# Patient Record
Sex: Male | Born: 1960 | Race: White | Hispanic: No | Marital: Married | Smoking: Never smoker
Health system: Southern US, Community
[De-identification: ages and names within clinical notes are randomized; demographics above are authoritative.]

## PROBLEM LIST (undated history)

## (undated) DIAGNOSIS — E0590 Thyrotoxicosis, unspecified without thyrotoxic crisis or storm: Secondary | ICD-10-CM

## (undated) DIAGNOSIS — E079 Disorder of thyroid, unspecified: Secondary | ICD-10-CM

## (undated) DIAGNOSIS — Z8739 Personal history of other diseases of the musculoskeletal system and connective tissue: Secondary | ICD-10-CM

## (undated) DIAGNOSIS — K219 Gastro-esophageal reflux disease without esophagitis: Secondary | ICD-10-CM

## (undated) DIAGNOSIS — Z87442 Personal history of urinary calculi: Secondary | ICD-10-CM

## (undated) DIAGNOSIS — M1990 Unspecified osteoarthritis, unspecified site: Secondary | ICD-10-CM

## (undated) DIAGNOSIS — E039 Hypothyroidism, unspecified: Secondary | ICD-10-CM

## (undated) HISTORY — DX: Disorder of thyroid, unspecified: E07.9

## (undated) HISTORY — PX: FRACTURE SURGERY: SHX138

## (undated) HISTORY — PX: COLONOSCOPY: SHX174

## (undated) HISTORY — DX: Personal history of other diseases of the musculoskeletal system and connective tissue: Z87.39

## (undated) HISTORY — DX: Unspecified osteoarthritis, unspecified site: M19.90

---

## 2008-11-04 ENCOUNTER — Ambulatory Visit: Payer: Self-pay | Admitting: Unknown Physician Specialty

## 2010-05-30 IMAGING — NM NM THYROID IMAGING W/ UPTAKE SINGLE (24 HR)
1 series · 3 of 3 positions shown · non-contrast
Comparison: none

REASON FOR EXAM: hyperthyroid     depressed TSH
COMMENTS:

[Series 1000: (id) thyroid scan · 2.40mm/px · 3 of 3 slices shown]
[im 1/3  full-range]
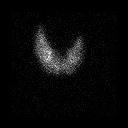
[im 2/3  full-range]
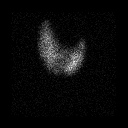
[im 3/3  full-range]
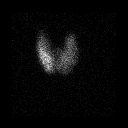

[3 of 3 positions shown; findings below may reference images not displayed]

PROCEDURE:     NM  - NM THYROID A-1D2 24 HR [DATE]  [DATE]

RESULT:     Following oral administration of 142.2 microcuries radioactive
iodine A-1D2, the 6-hour uptake measured 22.5% and the 24-hour uptake
measured 36%. The 24-hour uptake is slightly above the upper limit for
normal which is 35% and is, therefore, in the hyperthyroid range. Thyroid
scan was performed in the anterior and both oblique views. The right lobe is
slightly larger than the left. There is homogeneous distribution of tracer
activity in both lobes. No hot or cold nodules are seen.
IMPRESSION: 1. The 24-hour thyroid uptake measures 36% which is above the normal range.
2. Normal thyroid scan.

## 2013-08-03 ENCOUNTER — Ambulatory Visit: Payer: Self-pay | Admitting: Gastroenterology

## 2013-08-06 LAB — PATHOLOGY REPORT

## 2014-07-03 ENCOUNTER — Ambulatory Visit: Payer: Self-pay | Admitting: Family Medicine

## 2014-08-16 ENCOUNTER — Ambulatory Visit
Admit: 2014-08-16 | Disposition: A | Discharge status: Home / Self Care | Payer: Self-pay | Attending: General Practice | Admitting: General Practice

## 2014-09-08 NOTE — Op Note (Signed)
PATIENT NAME:  Juan Hernandez, Juan Hernandez MR#:  [id number] DATE OF BIRTH:  10-24-1960  DATE OF PROCEDURE:  08/16/2014  PREOPERATIVE DIAGNOSIS: Internal derangement of the left knee.   POSTOPERATIVE DIAGNOSES:  1. Tear of the posterior horn, medial meniscus.  2. 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   PROCEDURES PERFORMED: Left knee arthroscopy, partial medial meniscectomy, and medial chondroplasty.   SURGEON: Illene LabradorJames P. Angie FavaHooten Jr., MD   ANESTHESIA: General.   ESTIMATED BLOOD LOSS: Minimal.   TOURNIQUET TIME: Not used.   DRAINS: None.   INDICATIONS FOR SURGERY: The patient is a 54 year old male who has been seen for complaints of persistent left knee pain. MRI demonstrated findings consistent with meniscal pathology. After discussion of the risks and benefits of surgical intervention, the patient expressed understanding of the risks and benefits and agreed with plans for surgical intervention.   PROCEDURE IN DETAIL: The patient was brought into the operating room and, after adequate general anesthesia was achieved, a tourniquet was placed on the patient's left thigh and leg was placed in a leg holder. All bony prominences were well padded. The patient's left knee and leg were cleaned and prepped with alcohol and DuraPrep, draped in the usual sterile fashion. A "timeout" was performed as per usual protocol. The anticipated portal sites were injected with 0.25% Marcaine with epinephrine. An anterolateral portal was created and a cannula was inserted. A small effusion was evacuated. The scope was inserted and the knee was distended with fluid using the pump. The scope was advanced down the medial gutter into the medial compartment of the knee. Under visualization with the scope, an anteromedial portal was created and hook probe was inserted. Inspection of the medial compartment demonstrated a complex tear of the posterior horn of the medial meniscus with a flap-type lesion, as  well as what appeared to be horizontal cleavage tear. The area of the tear was debrided using meniscal punches and a 4.5 mm shaver. Final contouring of the posterior and transitional zone was performed using a 50 degrees ArthroCare wand. The anterior horn was visualized and probed and felt to be stable. Inspection of the articular cartilage demonstrated some grade 2 to 3 changes, primarily involving the lateral margin of the medial femoral condyle. These areas were debrided using the 50 degrees ArthroCare wand. The scope was removed from the anterolateral portal and reinserted via the anteromedial portal, so as to better visualize the lateral compartment. Inspection of the anterior cruciate ligament was performed with ACL noted to be stable. The scope was then advanced into the lateral compartment. The articular surface of the lateral compartment was in excellent condition. The lateral meniscus was visualized and probed and felt to be stable with only mild fraying along the inner margin. Finally, the scope was positioned so as to visualize the patellofemoral articulation. Good patellar tracking was noted. The articular surface was in good condition. The knee was irrigated with copious amounts of fluid and then suctioned dry. The anterolateral portal was reapproximated using 3-0 nylon. A combination of 0.25% Marcaine with epinephrine and 4 mg morphine was injected via the scope. The scope was removed and the anteromedial portal was reapproximated using #3-0 nylon. A sterile dressing was applied followed by application of ice wrap. The patient tolerated the procedure well. She was transported to the recovery room in stable condition.     ____________________________ Illene LabradorJames P. Angie FavaHooten Jr., MD jph:mw D: 08/17/2014 11:27:17 ET T: 08/17/2014 11:42:42 ET JOB#: [id number]  cc: Fayrene FearingJames P.  Angie Fava., MD, <Dictator> JAMES P Angie Fava MD ELECTRONICALLY SIGNED 08/18/2014 8:15

## 2019-08-16 ENCOUNTER — Ambulatory Visit: Payer: Self-pay | Attending: Internal Medicine

## 2019-08-16 DIAGNOSIS — Z23 Encounter for immunization: Secondary | ICD-10-CM

## 2019-08-16 NOTE — Progress Notes (Signed)
   Covid-19 Vaccination Clinic  Name:  Juan Hernandez    MRN: [id number] DOB: 12-10-1960  08/16/2019  [name] was observed post Covid-19 immunization for 15 minutes without incident. He was provided with Vaccine Information Sheet and instruction to access the V-Safe system.   [name] was instructed to call 911 with any severe reactions post vaccine: Marland Kitchen Difficulty breathing  . Swelling of face and throat  . A fast heartbeat  . A bad rash all over body  . Dizziness and weakness   Immunizations Administered    Name Date Dose VIS Date Route   Pfizer COVID-19 Vaccine 08/16/2019  8:56 AM 0.3 mL 04/20/2019 Intramuscular   Manufacturer: [employer], [employer]   Lot: [id number]   NDC: [address]-1

## 2019-09-11 ENCOUNTER — Ambulatory Visit: Payer: Self-pay | Attending: Internal Medicine

## 2019-09-11 DIAGNOSIS — Z23 Encounter for immunization: Secondary | ICD-10-CM

## 2019-09-11 NOTE — Progress Notes (Signed)
   Covid-19 Vaccination Clinic  Name:  Juan Hernandez    MRN: [id number] DOB: 07/20/1960  09/11/2019  [name] was observed post Covid-19 immunization for 15 minutes without incident. He was provided with Vaccine Information Sheet and instruction to access the V-Safe system.   [name] was instructed to call 911 with any severe reactions post vaccine: Marland Kitchen Difficulty breathing  . Swelling of face and throat  . A fast heartbeat  . A bad rash all over body  . Dizziness and weakness   Immunizations Administered    Name Date Dose VIS Date Route   Pfizer COVID-19 Vaccine 09/11/2019  9:14 AM 0.3 mL 07/04/2018 Intramuscular   Manufacturer: [employer], [employer]   Lot: [id number]   NDC: [address]-4

## 2022-05-01 DIAGNOSIS — Z6838 Body mass index (BMI) 38.0-38.9, adult: Secondary | ICD-10-CM | POA: Diagnosis not present

## 2022-05-01 DIAGNOSIS — B001 Herpesviral vesicular dermatitis: Secondary | ICD-10-CM | POA: Diagnosis not present

## 2022-10-07 ENCOUNTER — Encounter: Payer: Self-pay | Admitting: Internal Medicine

## 2022-10-07 ENCOUNTER — Ambulatory Visit: Payer: 59 | Admitting: Internal Medicine

## 2022-10-07 VITALS — BP 126/76 | HR 65 | Temp 98.2°F | Resp 16 | Ht 68.0 in | Wt 274.0 lb

## 2022-10-07 DIAGNOSIS — E7800 Pure hypercholesterolemia, unspecified: Secondary | ICD-10-CM | POA: Diagnosis not present

## 2022-10-07 DIAGNOSIS — Z125 Encounter for screening for malignant neoplasm of prostate: Secondary | ICD-10-CM

## 2022-10-07 DIAGNOSIS — E039 Hypothyroidism, unspecified: Secondary | ICD-10-CM | POA: Diagnosis not present

## 2022-10-07 DIAGNOSIS — R7989 Other specified abnormal findings of blood chemistry: Secondary | ICD-10-CM | POA: Diagnosis not present

## 2022-10-07 DIAGNOSIS — M109 Gout, unspecified: Secondary | ICD-10-CM | POA: Diagnosis not present

## 2022-10-07 DIAGNOSIS — Z1211 Encounter for screening for malignant neoplasm of colon: Secondary | ICD-10-CM

## 2022-10-07 MED ORDER — COLCHICINE 0.6 MG PO TABS: 0.6000 mg | ORAL_TABLET | Freq: Two times a day (BID) | ORAL | 0 refills | Status: DC | PRN

## 2022-10-07 NOTE — Progress Notes (Addendum)
Subjective:    Patient ID: Juan Hernandez, male    DOB: 1960-05-12, 62 y.o.   MRN: [id number]  Patient here for  Chief Complaint  Patient presents with   Establish Care    HPI Here to establish care.  Has been seeing Dr Noralee Chars.  Has a history of hypercholesterolemia, gout and hypothyroidism.  Previous ankle surgery.  History of compound fracture - age 52.  Reports no chest pain or sob.  No abdominal pain or bowel change reported.  Some allergy issues.  Nasal stuffiness.  Occasionally smokes a cigar.  Has some concern regarding possible gout flare.  States had first episode years ago.  A few months ago, had a flare.  Noticed increased pain - last 24 hours.  Noticed left ankle.  Feels similar to previous gout flare.  Increased pain. Taking ibuprofen.  Nocturia x 1.  Had colonoscopy age 25.  Paternal uncle colon cancer. Has history of low testosterone.  Shingles at Christmas.     Past Medical History:  Diagnosis Date   Arthritis    History of gout    Thyroid disease    Past Surgical History:  Procedure Laterality Date   FRACTURE SURGERY  1983   Left ankle compound fracture   Family History  Problem Relation Age of Onset   COPD Mother    Diabetes Mother    Arthritis Father    Obesity Father    Social History   Socioeconomic History   Marital status: Married    Spouse name: Not on file   Number of children: Not on file   Years of education: Not on file   Highest education level: Not on file  Occupational History   Not on file  Tobacco Use   Smoking status: Never   Smokeless tobacco: Never  Substance and Sexual Activity   Alcohol use: Yes    Alcohol/week: 6.0 standard drinks of alcohol    Types: 2 Glasses of wine, 2 Cans of beer, 2 Shots of liquor per week   Drug use: Not Currently   Sexual activity: Not Currently  Other Topics Concern   Not on file  Social History Narrative   Not on file   Social Determinants of Health   Financial Resource Strain: Not on  file  Food Insecurity: Not on file  Transportation Needs: Not on file  Physical Activity: Not on file  Stress: Not on file  Social Connections: Not on file     Review of Systems  Constitutional:  Negative for appetite change and unexpected weight change.  HENT:  Negative for sinus pressure.        Occasional allergies/congestion.   Respiratory:  Negative for cough, chest tightness and shortness of breath.   Cardiovascular:  Negative for chest pain and palpitations.  Gastrointestinal:  Negative for abdominal pain, diarrhea, nausea and vomiting.  Genitourinary:  Negative for difficulty urinating and dysuria.  Musculoskeletal:  Negative for joint swelling and myalgias.  Skin:  Negative for color change and rash.  Neurological:  Negative for dizziness and headaches.  Psychiatric/Behavioral:  Negative for agitation and dysphoric mood.        Objective:     BP 126/76   Pulse 65   Temp 98.2 F (36.8 C)   Resp 16   Ht 5\' 8"  (1.727 m)   Wt 274 lb (124.3 kg)   SpO2 98%   BMI 41.66 kg/m  Wt Readings from Last 3 Encounters:  10/07/22 274 lb (124.3 kg)  Physical Exam Vitals reviewed.  Constitutional:      General: He is not in acute distress.    Appearance: Normal appearance. He is well-developed.  HENT:     Head: Normocephalic and atraumatic.     Right Ear: External ear normal.     Left Ear: External ear normal.  Eyes:     General: No scleral icterus.       Right eye: No discharge.        Left eye: No discharge.     Conjunctiva/sclera: Conjunctivae normal.  Cardiovascular:     Rate and Rhythm: Normal rate and regular rhythm.  Pulmonary:     Effort: Pulmonary effort is normal. No respiratory distress.     Breath sounds: Normal breath sounds.  Abdominal:     General: Bowel sounds are normal.     Palpations: Abdomen is soft.     Tenderness: There is no abdominal tenderness.  Musculoskeletal:        General: No swelling or tenderness.     Cervical back: Neck  supple. No tenderness.  Lymphadenopathy:     Cervical: No cervical adenopathy.  Skin:    Findings: No erythema or rash.  Neurological:     Mental Status: He is alert.  Psychiatric:        Mood and Affect: Mood normal.        Behavior: Behavior normal.      Outpatient Encounter Medications as of 10/07/2022  Medication Sig   colchicine 0.6 MG tablet Take 1 tablet (0.6 mg total) by mouth 2 (two) times daily as needed.   No facility-administered encounter medications on file as of 10/07/2022.     Lab Results  Component Value Date   WBC 7.2 10/11/2022   HGB 14.5 10/11/2022   HCT 42.8 10/11/2022   PLT 207.0 10/11/2022   GLUCOSE 87 10/11/2022   CHOL 260 (H) 10/11/2022   TRIG 127.0 10/11/2022   HDL 38.80 (L) 10/11/2022   LDLCALC 195 (H) 10/11/2022   ALT 51 10/11/2022   AST 67 (H) 10/11/2022   NA 136 10/11/2022   K 4.5 10/11/2022   CL 100 10/11/2022   CREATININE 1.20 10/11/2022   BUN 14 10/11/2022   CO2 28 10/11/2022   TSH 98.93 (H) 10/11/2022   PSA 0.17 10/11/2022    No results found.     Assessment & Plan:  Hypercholesterolemia Assessment & Plan: Low cholesterol diet and exercise.  Follow lipid panel.   Orders: -     CBC with Differential/Platelet; Future -     Basic metabolic panel; Future -     Hepatic function panel; Future -     Lipid panel; Future  Hypothyroidism, unspecified type Assessment & Plan: Documented history of thyroid disease.  Check tsh.   Orders: -     TSH; Future  Gout, unspecified cause, unspecified chronicity, unspecified site Assessment & Plan: History of gout.  Pain currently - ankle.  Feels similar to previous gout flare.  Treat with colchicine.  Has been taking ibuprofen.  Check uric acid level.    Orders: -     Uric acid; Future  Prostate cancer screening -     PSA; Future  Low testosterone level in male Assessment & Plan: Reports history of low testosterone level.  Check testosterone level with next labs.   Orders: -      Testosterone; Future  Colon cancer screening Assessment & Plan: Last colonoscopy age 30.  Due f/u colonoscopy.    Other orders -  Colchicine; Take 1 tablet (0.6 mg total) by mouth 2 (two) times daily as needed.  Dispense: 60 tablet; Refill: 0     Dale Queets, MD

## 2022-10-11 ENCOUNTER — Encounter: Payer: Self-pay | Admitting: Internal Medicine

## 2022-10-11 ENCOUNTER — Other Ambulatory Visit (INDEPENDENT_AMBULATORY_CARE_PROVIDER_SITE_OTHER): Payer: 59

## 2022-10-11 DIAGNOSIS — E7800 Pure hypercholesterolemia, unspecified: Secondary | ICD-10-CM

## 2022-10-11 DIAGNOSIS — Z125 Encounter for screening for malignant neoplasm of prostate: Secondary | ICD-10-CM | POA: Diagnosis not present

## 2022-10-11 DIAGNOSIS — E039 Hypothyroidism, unspecified: Secondary | ICD-10-CM

## 2022-10-11 DIAGNOSIS — R7989 Other specified abnormal findings of blood chemistry: Secondary | ICD-10-CM | POA: Insufficient documentation

## 2022-10-11 DIAGNOSIS — Z1211 Encounter for screening for malignant neoplasm of colon: Secondary | ICD-10-CM | POA: Insufficient documentation

## 2022-10-11 LAB — HEPATIC FUNCTION PANEL
ALT: 51 U/L (ref 0–53)
AST: 67 U/L — ABNORMAL HIGH (ref 0–37)
Albumin: 4.4 g/dL (ref 3.5–5.2)
Alkaline Phosphatase: 51 U/L (ref 39–117)
Bilirubin, Direct: 0.1 mg/dL (ref 0.0–0.3)
Total Bilirubin: 0.7 mg/dL (ref 0.2–1.2)
Total Protein: 8.1 g/dL (ref 6.0–8.3)

## 2022-10-11 LAB — LIPID PANEL
Cholesterol: 260 mg/dL — ABNORMAL HIGH (ref 0–200)
HDL: 38.8 mg/dL — ABNORMAL LOW (ref >39.00)
LDL Cholesterol: 195 mg/dL — ABNORMAL HIGH (ref 0–99)
NonHDL: 220.82
Total CHOL/HDL Ratio: 7
Triglycerides: 127 mg/dL (ref 0.0–149.0)
VLDL: 25.4 mg/dL (ref 0.0–40.0)

## 2022-10-11 LAB — CBC WITH DIFFERENTIAL/PLATELET
Basophils Absolute: 0.2 10*3/uL — ABNORMAL HIGH (ref 0.0–0.1)
Basophils Relative: 2.1 % (ref 0.0–3.0)
Eosinophils Absolute: 0.4 10*3/uL (ref 0.0–0.7)
Eosinophils Relative: 4.9 % (ref 0.0–5.0)
HCT: 42.8 % (ref 39.0–52.0)
Hemoglobin: 14.5 g/dL (ref 13.0–17.0)
Lymphocytes Relative: 32.6 % (ref 12.0–46.0)
Lymphs Abs: 2.3 10*3/uL (ref 0.7–4.0)
MCHC: 33.9 g/dL (ref 30.0–36.0)
MCV: 92.1 fl (ref 78.0–100.0)
Monocytes Absolute: 0.7 10*3/uL (ref 0.1–1.0)
Monocytes Relative: 9.9 % (ref 3.0–12.0)
Neutro Abs: 3.6 10*3/uL (ref 1.4–7.7)
Neutrophils Relative %: 50.5 % (ref 43.0–77.0)
Platelets: 207 10*3/uL (ref 150.0–400.0)
RBC: 4.65 Mil/uL (ref 4.22–5.81)
RDW: 14.5 % (ref 11.5–15.5)
WBC: 7.2 10*3/uL (ref 4.0–10.5)

## 2022-10-11 LAB — BASIC METABOLIC PANEL
BUN: 14 mg/dL (ref 6–23)
CO2: 28 mEq/L (ref 19–32)
Calcium: 9.2 mg/dL (ref 8.4–10.5)
Chloride: 100 mEq/L (ref 96–112)
Creatinine, Ser: 1.2 mg/dL (ref 0.40–1.50)
GFR: 65.51 mL/min (ref >60.00)
Glucose, Bld: 87 mg/dL (ref 70–99)
Potassium: 4.5 mEq/L (ref 3.5–5.1)
Sodium: 136 mEq/L (ref 135–145)

## 2022-10-11 LAB — TSH: TSH: 98.93 u[IU]/mL — ABNORMAL HIGH (ref 0.35–5.50)

## 2022-10-11 LAB — PSA: PSA: 0.17 ng/mL (ref 0.10–4.00)

## 2022-10-11 NOTE — Assessment & Plan Note (Signed)
Documented history of thyroid disease.  Check tsh.

## 2022-10-11 NOTE — Assessment & Plan Note (Signed)
Low cholesterol diet and exercise.  Follow lipid panel.   

## 2022-10-11 NOTE — Assessment & Plan Note (Signed)
Last colonoscopy age 62.  Due f/u colonoscopy.

## 2022-10-11 NOTE — Assessment & Plan Note (Signed)
Reports history of low testosterone level.  Check testosterone level with next labs.

## 2022-10-11 NOTE — Assessment & Plan Note (Signed)
History of gout.  Pain currently - ankle.  Feels similar to previous gout flare.  Treat with colchicine.  Has been taking ibuprofen.  Check uric acid level.

## 2022-10-11 NOTE — Addendum Note (Signed)
Addended by: Charm Barges on: 10/11/2022 10:27 PM   Modules accepted: Orders

## 2022-10-12 ENCOUNTER — Other Ambulatory Visit (INDEPENDENT_AMBULATORY_CARE_PROVIDER_SITE_OTHER): Payer: 59

## 2022-10-12 DIAGNOSIS — R7989 Other specified abnormal findings of blood chemistry: Secondary | ICD-10-CM | POA: Diagnosis not present

## 2022-10-12 DIAGNOSIS — M109 Gout, unspecified: Secondary | ICD-10-CM

## 2022-10-12 LAB — TESTOSTERONE: Testosterone: 181.13 ng/dL — ABNORMAL LOW (ref 300.00–890.00)

## 2022-10-12 LAB — URIC ACID: Uric Acid, Serum: 5.8 mg/dL (ref 4.0–7.8)

## 2022-10-13 ENCOUNTER — Other Ambulatory Visit: Payer: Self-pay

## 2022-10-13 DIAGNOSIS — R7989 Other specified abnormal findings of blood chemistry: Secondary | ICD-10-CM

## 2022-10-13 DIAGNOSIS — E039 Hypothyroidism, unspecified: Secondary | ICD-10-CM

## 2022-10-13 MED ORDER — LEVOTHYROXINE SODIUM 50 MCG PO TABS: 50.0000 ug | ORAL_TABLET | Freq: Every day | ORAL | 0 refills | Status: DC

## 2022-10-27 ENCOUNTER — Other Ambulatory Visit (INDEPENDENT_AMBULATORY_CARE_PROVIDER_SITE_OTHER): Payer: 59

## 2022-10-27 DIAGNOSIS — R7989 Other specified abnormal findings of blood chemistry: Secondary | ICD-10-CM

## 2022-10-27 DIAGNOSIS — E039 Hypothyroidism, unspecified: Secondary | ICD-10-CM | POA: Diagnosis not present

## 2022-10-27 LAB — HEPATIC FUNCTION PANEL
ALT: 46 U/L (ref 0–53)
AST: 57 U/L — ABNORMAL HIGH (ref 0–37)
Albumin: 4.3 g/dL (ref 3.5–5.2)
Alkaline Phosphatase: 43 U/L (ref 39–117)
Bilirubin, Direct: 0.1 mg/dL (ref 0.0–0.3)
Total Bilirubin: 0.6 mg/dL (ref 0.2–1.2)
Total Protein: 8.2 g/dL (ref 6.0–8.3)

## 2022-10-27 LAB — TSH: TSH: 73.94 u[IU]/mL — ABNORMAL HIGH (ref 0.35–5.50)

## 2022-10-28 ENCOUNTER — Telehealth: Payer: Self-pay

## 2022-10-28 NOTE — Telephone Encounter (Signed)
-----   Message from Dale Coweta, MD sent at 10/28/2022  5:04 AM EDT ----- Notify - TSH has improved, but still elevated.  Confirm on synthroid q day.  If so, then increase to q day and make sure taking correctly. (Please cancel the 11/24/22 lab.  He had tsh drawn yesterday).  Schedule f/u tsh in 4 weeks.  Also, one liver test remaining slightly elevated.  Continue diet and exercise.  Will follow.  Would like to schedule an abdominal ultrasound to better evaluate the liver.

## 2022-10-31 ENCOUNTER — Other Ambulatory Visit: Payer: Self-pay | Admitting: Internal Medicine

## 2022-11-01 NOTE — Telephone Encounter (Signed)
Received notification for refill colchicine.  This was given to him for an acute flare.  Please call and confirm no foot pain now.  Should not need to continue on colchicine.  Is he having any symptoms now?

## 2022-11-04 ENCOUNTER — Other Ambulatory Visit: Payer: Self-pay

## 2022-11-04 MED ORDER — LEVOTHYROXINE SODIUM 75 MCG PO TABS: 75.0000 ug | ORAL_TABLET | Freq: Every day | ORAL | 0 refills | Status: DC

## 2022-11-04 NOTE — Telephone Encounter (Signed)
Per result note 75 mcg levothyroxine has been sent in to pharmacy.

## 2022-11-05 NOTE — Telephone Encounter (Signed)
LMTCB

## 2022-11-24 ENCOUNTER — Other Ambulatory Visit: Payer: 59

## 2022-12-07 ENCOUNTER — Encounter: Payer: Self-pay | Admitting: Internal Medicine

## 2022-12-07 ENCOUNTER — Ambulatory Visit (INDEPENDENT_AMBULATORY_CARE_PROVIDER_SITE_OTHER): Payer: 59 | Admitting: Internal Medicine

## 2022-12-07 VITALS — BP 118/70 | HR 68 | Temp 97.9°F | Resp 16 | Ht 68.0 in | Wt 269.0 lb

## 2022-12-07 DIAGNOSIS — Z1211 Encounter for screening for malignant neoplasm of colon: Secondary | ICD-10-CM

## 2022-12-07 DIAGNOSIS — E039 Hypothyroidism, unspecified: Secondary | ICD-10-CM | POA: Diagnosis not present

## 2022-12-07 DIAGNOSIS — Z0000 Encounter for general adult medical examination without abnormal findings: Secondary | ICD-10-CM

## 2022-12-07 DIAGNOSIS — M109 Gout, unspecified: Secondary | ICD-10-CM | POA: Diagnosis not present

## 2022-12-07 DIAGNOSIS — E7800 Pure hypercholesterolemia, unspecified: Secondary | ICD-10-CM

## 2022-12-07 DIAGNOSIS — R7989 Other specified abnormal findings of blood chemistry: Secondary | ICD-10-CM

## 2022-12-07 LAB — HEPATIC FUNCTION PANEL
ALT: 41 U/L (ref 0–53)
AST: 38 U/L — ABNORMAL HIGH (ref 0–37)
Albumin: 4.2 g/dL (ref 3.5–5.2)
Alkaline Phosphatase: 44 U/L (ref 39–117)
Bilirubin, Direct: 0.1 mg/dL (ref 0.0–0.3)
Total Bilirubin: 0.7 mg/dL (ref 0.2–1.2)
Total Protein: 7.9 g/dL (ref 6.0–8.3)

## 2022-12-07 LAB — TSH: TSH: 46.27 u[IU]/mL — ABNORMAL HIGH (ref 0.35–5.50)

## 2022-12-07 NOTE — Assessment & Plan Note (Signed)
Discussed today.  Discussed possible fatty liver.  Diet and exercise.  Recheck liver panel today.

## 2022-12-07 NOTE — Assessment & Plan Note (Signed)
On synthroid.  Dose adjusted.  Recheck tsh today.

## 2022-12-07 NOTE — Assessment & Plan Note (Signed)
Last colonoscopy age 62.  Due f/u colonoscopy. Referral placed.

## 2022-12-07 NOTE — Addendum Note (Signed)
Addended by: Rita Ohara D on: 12/07/2022 09:01 AM   Modules accepted: Orders

## 2022-12-07 NOTE — Assessment & Plan Note (Addendum)
Physical today 12/07/22. Last colonoscopy age 62.  Due colonoscopy.  Order placed for referral. PSA .17 - 10/2022.

## 2022-12-07 NOTE — Assessment & Plan Note (Signed)
Previously treated with colchicine.  No flare now.  Follow.

## 2022-12-07 NOTE — Assessment & Plan Note (Signed)
Low cholesterol diet and exercise.  Follow lipid panel.   

## 2022-12-07 NOTE — Progress Notes (Signed)
Subjective:    Patient ID: Lorina Rabon, male    DOB: 1960/11/29, 62 y.o.   MRN: [id number]  Patient here for  Chief Complaint  Patient presents with   Annual Exam    HPI Here for a physical exam.  Has a history of gout, hypothyroidism and hypercholesterolemia.  Established care last visit.  Concern regarding gout flare.  Treated with colchicine. No flare now.  TSH - elevated - recent check.  Synthroid dose adjusted.  No chest pain.  Breathing stable.  Push mows yard.  No problems.  No abdominal pain.  Bowels stable.  Handling stress.    Past Medical History:  Diagnosis Date   Arthritis    History of gout    Thyroid disease    Past Surgical History:  Procedure Laterality Date   FRACTURE SURGERY  1983   Left ankle compound fracture   Family History  Problem Relation Age of Onset   COPD Mother    Diabetes Mother    Arthritis Father    Obesity Father    Social History   Socioeconomic History   Marital status: Married    Spouse name: Not on file   Number of children: Not on file   Years of education: Not on file   Highest education level: Not on file  Occupational History   Not on file  Tobacco Use   Smoking status: Never   Smokeless tobacco: Never  Substance and Sexual Activity   Alcohol use: Yes    Alcohol/week: 6.0 standard drinks of alcohol    Types: 2 Glasses of wine, 2 Cans of beer, 2 Shots of liquor per week   Drug use: Not Currently   Sexual activity: Not Currently  Other Topics Concern   Not on file  Social History Narrative   Not on file   Social Determinants of Health   Financial Resource Strain: Not on file  Food Insecurity: Not on file  Transportation Needs: Not on file  Physical Activity: Not on file  Stress: Not on file  Social Connections: Not on file     Review of Systems  Constitutional:  Negative for appetite change and unexpected weight change.  HENT:  Negative for congestion, sinus pressure and sore throat.   Eyes:   Negative for pain and visual disturbance.  Respiratory:  Negative for cough, chest tightness and shortness of breath.   Cardiovascular:  Negative for chest pain, palpitations and leg swelling.  Gastrointestinal:  Negative for abdominal pain, diarrhea, nausea and vomiting.  Genitourinary:  Negative for difficulty urinating and dysuria.  Musculoskeletal:  Negative for joint swelling and myalgias.  Skin:  Negative for color change and rash.  Neurological:  Negative for dizziness and headaches.  Hematological:  Negative for adenopathy. Does not bruise/bleed easily.  Psychiatric/Behavioral:  Negative for agitation and dysphoric mood.        Objective:     BP 118/70   Pulse 68   Temp 97.9 F (36.6 C)   Resp 16   Ht 5\' 8"  (1.727 m)   Wt 269 lb (122 kg)   SpO2 97%   BMI 40.90 kg/m  Wt Readings from Last 3 Encounters:  12/07/22 269 lb (122 kg)  10/07/22 274 lb (124.3 kg)    Physical Exam Constitutional:      General: He is not in acute distress.    Appearance: Normal appearance. He is well-developed.  HENT:     Head: Normocephalic and atraumatic.     Right  Ear: External ear normal.     Left Ear: External ear normal.  Eyes:     General: No scleral icterus.       Right eye: No discharge.        Left eye: No discharge.     Conjunctiva/sclera: Conjunctivae normal.  Neck:     Thyroid: No thyromegaly.  Cardiovascular:     Rate and Rhythm: Normal rate and regular rhythm.  Pulmonary:     Effort: No respiratory distress.     Breath sounds: Normal breath sounds. No wheezing.  Abdominal:     General: Bowel sounds are normal.     Palpations: Abdomen is soft.     Tenderness: There is no abdominal tenderness.  Musculoskeletal:        General: No swelling or tenderness.     Cervical back: Neck supple. No tenderness.     Comments: DP pulses palpable and equal bilaterally.   Lymphadenopathy:     Cervical: No cervical adenopathy.  Skin:    Findings: No erythema or rash.   Neurological:     Mental Status: He is alert and oriented to person, place, and time.  Psychiatric:        Mood and Affect: Mood normal.        Behavior: Behavior normal.      Outpatient Encounter Medications as of 12/07/2022  Medication Sig   colchicine 0.6 MG tablet Take 1 tablet (0.6 mg total) by mouth 2 (two) times daily as needed.   levothyroxine (SYNTHROID) 75 MCG tablet Take 1 tablet (75 mcg total) by mouth daily.   No facility-administered encounter medications on file as of 12/07/2022.     Lab Results  Component Value Date   WBC 7.2 10/11/2022   HGB 14.5 10/11/2022   HCT 42.8 10/11/2022   PLT 207.0 10/11/2022   GLUCOSE 87 10/11/2022   CHOL 260 (H) 10/11/2022   TRIG 127.0 10/11/2022   HDL 38.80 (L) 10/11/2022   LDLCALC 195 (H) 10/11/2022   ALT 46 10/27/2022   AST 57 (H) 10/27/2022   NA 136 10/11/2022   K 4.5 10/11/2022   CL 100 10/11/2022   CREATININE 1.20 10/11/2022   BUN 14 10/11/2022   CO2 28 10/11/2022   TSH 73.94 (H) 10/27/2022   PSA 0.17 10/11/2022       Assessment & Plan:  Routine general medical examination at a health care facility  Healthcare maintenance Assessment & Plan: Physical today 12/07/22. Last colonoscopy age 51.  Due colonoscopy.  Order placed for referral. PSA .17 - 10/2022.    Hypercholesterolemia Assessment & Plan: Low cholesterol diet and exercise.  Follow lipid panel.    Hypothyroidism, unspecified type Assessment & Plan: On synthroid.  Dose adjusted.  Recheck tsh today.   Orders: -     TSH  Abnormal liver function test Assessment & Plan: Discussed today.  Discussed possible fatty liver.  Diet and exercise.  Recheck liver panel today.   Orders: -     Hepatic function panel  Colon cancer screening Assessment & Plan: Last colonoscopy age 68.  Due f/u colonoscopy. Referral placed.   Orders: -     Ambulatory referral to Gastroenterology  Gout, unspecified cause, unspecified chronicity, unspecified site Assessment &  Plan: Previously treated with colchicine.  No flare now.  Follow.       Dale Reese, MD

## 2022-12-08 ENCOUNTER — Other Ambulatory Visit: Payer: 59

## 2022-12-08 ENCOUNTER — Other Ambulatory Visit: Payer: Self-pay

## 2022-12-08 MED ORDER — LEVOTHYROXINE SODIUM 100 MCG PO TABS: 100.0000 ug | ORAL_TABLET | Freq: Every day | ORAL | 0 refills | Status: DC

## 2022-12-23 ENCOUNTER — Encounter (INDEPENDENT_AMBULATORY_CARE_PROVIDER_SITE_OTHER): Payer: Self-pay

## 2023-01-25 ENCOUNTER — Ambulatory Visit: Payer: 59 | Admitting: Internal Medicine

## 2023-01-25 ENCOUNTER — Encounter: Payer: Self-pay | Admitting: Internal Medicine

## 2023-01-25 VITALS — BP 124/72 | HR 76 | Temp 97.9°F | Ht 68.0 in | Wt 270.2 lb

## 2023-01-25 DIAGNOSIS — E039 Hypothyroidism, unspecified: Secondary | ICD-10-CM

## 2023-01-25 DIAGNOSIS — Z23 Encounter for immunization: Secondary | ICD-10-CM | POA: Diagnosis not present

## 2023-01-25 DIAGNOSIS — E7800 Pure hypercholesterolemia, unspecified: Secondary | ICD-10-CM

## 2023-01-25 DIAGNOSIS — Z1211 Encounter for screening for malignant neoplasm of colon: Secondary | ICD-10-CM

## 2023-01-25 DIAGNOSIS — R7989 Other specified abnormal findings of blood chemistry: Secondary | ICD-10-CM

## 2023-01-25 DIAGNOSIS — M109 Gout, unspecified: Secondary | ICD-10-CM | POA: Diagnosis not present

## 2023-01-25 DIAGNOSIS — Z136 Encounter for screening for cardiovascular disorders: Secondary | ICD-10-CM | POA: Insufficient documentation

## 2023-01-25 DIAGNOSIS — R931 Abnormal findings on diagnostic imaging of heart and coronary circulation: Secondary | ICD-10-CM | POA: Insufficient documentation

## 2023-01-25 LAB — BASIC METABOLIC PANEL
BUN: 18 mg/dL (ref 6–23)
CO2: 27 meq/L (ref 19–32)
Calcium: 9.2 mg/dL (ref 8.4–10.5)
Chloride: 103 meq/L (ref 96–112)
Creatinine, Ser: 1.09 mg/dL (ref 0.40–1.50)
GFR: 73.37 mL/min (ref >60.00)
Glucose, Bld: 105 mg/dL — ABNORMAL HIGH (ref 70–99)
Potassium: 4.5 meq/L (ref 3.5–5.1)
Sodium: 137 meq/L (ref 135–145)

## 2023-01-25 LAB — LIPID PANEL
Cholesterol: 207 mg/dL — ABNORMAL HIGH (ref 0–200)
HDL: 37.1 mg/dL — ABNORMAL LOW (ref >39.00)
LDL Cholesterol: 135 mg/dL — ABNORMAL HIGH (ref 0–99)
NonHDL: 169.52
Total CHOL/HDL Ratio: 6
Triglycerides: 174 mg/dL — ABNORMAL HIGH (ref 0.0–149.0)
VLDL: 34.8 mg/dL (ref 0.0–40.0)

## 2023-01-25 LAB — HEPATIC FUNCTION PANEL
ALT: 37 U/L (ref 0–53)
AST: 34 U/L (ref 0–37)
Albumin: 3.9 g/dL (ref 3.5–5.2)
Alkaline Phosphatase: 50 U/L (ref 39–117)
Bilirubin, Direct: 0.1 mg/dL (ref 0.0–0.3)
Total Bilirubin: 0.5 mg/dL (ref 0.2–1.2)
Total Protein: 7.3 g/dL (ref 6.0–8.3)

## 2023-01-25 LAB — TSH: TSH: 47.84 u[IU]/mL — ABNORMAL HIGH (ref 0.35–5.50)

## 2023-01-25 NOTE — Assessment & Plan Note (Signed)
No flare.  Follow.

## 2023-01-25 NOTE — Assessment & Plan Note (Signed)
Last colonoscopy age 62.  Due f/u colonoscopy. Referral placed. Cost is an issue.  Wants to hold.  Will notify me when agreeable.

## 2023-01-25 NOTE — Progress Notes (Signed)
Subjective:    Patient ID: Juan Hernandez, male    DOB: November 27, 1960, 62 y.o.   MRN: [id number]  Patient here for  Chief Complaint  Patient presents with   Medication Management    HPI Here for a scheduled follow up - f/u regarding gout, hypothyroidism and hypercholesterolemia. Reports he is doing relatively well. Trying to work out in the yard. No chest pain.  Breathing stable.  Family history of heart issues.  Discussed calcium score.  No abdominal pain or bowel change.  Discussed colonoscopy.  Wants to hold due to cost.  Last age 63.  Discussed cologuard.  Has sty - right eye.  No significant swelling today.  Discussed warm compresses.     Past Medical History:  Diagnosis Date   Arthritis    History of gout    Thyroid disease    Past Surgical History:  Procedure Laterality Date   FRACTURE SURGERY  1983   Left ankle compound fracture   Family History  Problem Relation Age of Onset   COPD Mother    Diabetes Mother    Arthritis Father    Obesity Father    Social History   Socioeconomic History   Marital status: Married    Spouse name: Not on file   Number of children: Not on file   Years of education: Not on file   Highest education level: Not on file  Occupational History   Not on file  Tobacco Use   Smoking status: Never   Smokeless tobacco: Never  Substance and Sexual Activity   Alcohol use: Yes    Alcohol/week: 6.0 standard drinks of alcohol    Types: 2 Glasses of wine, 2 Cans of beer, 2 Shots of liquor per week   Drug use: Not Currently   Sexual activity: Not Currently  Other Topics Concern   Not on file  Social History Narrative   Not on file   Social Determinants of Health   Financial Resource Strain: Not on file  Food Insecurity: Not on file  Transportation Needs: Not on file  Physical Activity: Not on file  Stress: Not on file  Social Connections: Not on file     Review of Systems  Constitutional:  Negative for appetite change and  unexpected weight change.  HENT:  Negative for congestion and sinus pressure.   Respiratory:  Negative for cough, chest tightness and shortness of breath.   Cardiovascular:  Negative for chest pain, palpitations and leg swelling.  Gastrointestinal:  Negative for abdominal pain, diarrhea, nausea and vomiting.  Genitourinary:  Negative for difficulty urinating and dysuria.  Musculoskeletal:  Negative for joint swelling and myalgias.  Skin:  Negative for color change and rash.  Neurological:  Negative for dizziness and headaches.  Psychiatric/Behavioral:  Negative for agitation and dysphoric mood.        Objective:     BP 124/72   Pulse 76   Temp 97.9 F (36.6 C) (Oral)   Ht 5\' 8"  (1.727 m)   Wt 270 lb 3.2 oz (122.6 kg)   SpO2 95%   BMI 41.08 kg/m  Wt Readings from Last 3 Encounters:  01/25/23 270 lb 3.2 oz (122.6 kg)  12/07/22 269 lb (122 kg)  10/07/22 274 lb (124.3 kg)    Physical Exam Constitutional:      General: He is not in acute distress.    Appearance: Normal appearance. He is well-developed.  HENT:     Head: Normocephalic and atraumatic.  Right Ear: External ear normal.     Left Ear: External ear normal.  Eyes:     General: No scleral icterus.       Right eye: No discharge.        Left eye: No discharge.  Cardiovascular:     Rate and Rhythm: Normal rate and regular rhythm.  Pulmonary:     Effort: Pulmonary effort is normal. No respiratory distress.     Breath sounds: Normal breath sounds.  Abdominal:     General: Bowel sounds are normal.     Palpations: Abdomen is soft.     Tenderness: There is no abdominal tenderness.  Musculoskeletal:        General: No swelling or tenderness.     Cervical back: Neck supple. No tenderness.  Lymphadenopathy:     Cervical: No cervical adenopathy.  Skin:    Findings: No erythema or rash.  Neurological:     Mental Status: He is alert.  Psychiatric:        Mood and Affect: Mood normal.        Behavior: Behavior  normal.      Outpatient Encounter Medications as of 01/25/2023  Medication Sig   colchicine 0.6 MG tablet Take 1 tablet (0.6 mg total) by mouth 2 (two) times daily as needed.   levothyroxine (SYNTHROID) 100 MCG tablet Take 1 tablet (100 mcg total) by mouth daily.   No facility-administered encounter medications on file as of 01/25/2023.     Lab Results  Component Value Date   WBC 7.2 10/11/2022   HGB 14.5 10/11/2022   HCT 42.8 10/11/2022   PLT 207.0 10/11/2022   GLUCOSE 87 10/11/2022   CHOL 260 (H) 10/11/2022   TRIG 127.0 10/11/2022   HDL 38.80 (L) 10/11/2022   LDLCALC 195 (H) 10/11/2022   ALT 41 12/07/2022   AST 38 (H) 12/07/2022   NA 136 10/11/2022   K 4.5 10/11/2022   CL 100 10/11/2022   CREATININE 1.20 10/11/2022   BUN 14 10/11/2022   CO2 28 10/11/2022   TSH 46.27 (H) 12/07/2022   PSA 0.17 10/11/2022    No results found.     Assessment & Plan:  Hypothyroidism, unspecified type Assessment & Plan: On synthroid.  Dose adjusted.  Recheck tsh today.   Orders: -     TSH  Hypercholesterolemia Assessment & Plan: Low cholesterol diet and exercise.  Follow lipid panel.   Orders: -     Basic metabolic panel -     Lipid panel  Abnormal liver function test Assessment & Plan: Have discussed possible fatty liver.  Diet and exercise.  Recheck liver panel today.   Orders: -     Hepatic function panel  Encounter for screening for coronary artery disease Assessment & Plan: Discussed calcium score.  Agreeable.    Orders: -     CT CARDIAC SCORING (SELF PAY ONLY); Future  Need for influenza vaccination -     Flu vaccine trivalent PF, 6mos and older(Flulaval,Afluria,Fluarix,Fluzone)  Colon cancer screening Assessment & Plan: Last colonoscopy age 25.  Due f/u colonoscopy. Referral placed. Cost is an issue.  Wants to hold.  Will notify me when agreeable.    Gout, unspecified cause, unspecified chronicity, unspecified site Assessment & Plan: No flare.  Follow.        Dale Belzoni, MD

## 2023-01-25 NOTE — Assessment & Plan Note (Signed)
Have discussed possible fatty liver.  Diet and exercise.  Recheck liver panel today.

## 2023-01-25 NOTE — Assessment & Plan Note (Signed)
On synthroid.  Dose adjusted.  Recheck tsh today.

## 2023-01-25 NOTE — Assessment & Plan Note (Signed)
Low cholesterol diet and exercise.  Follow lipid panel.

## 2023-01-25 NOTE — Assessment & Plan Note (Signed)
Discussed calcium score.  Agreeable.

## 2023-01-26 ENCOUNTER — Other Ambulatory Visit: Payer: Self-pay

## 2023-01-26 DIAGNOSIS — E039 Hypothyroidism, unspecified: Secondary | ICD-10-CM

## 2023-01-26 DIAGNOSIS — R739 Hyperglycemia, unspecified: Secondary | ICD-10-CM

## 2023-01-26 MED ORDER — LEVOTHYROXINE SODIUM 125 MCG PO TABS: 125.0000 ug | ORAL_TABLET | Freq: Every day | ORAL | 1 refills | Status: DC

## 2023-02-01 ENCOUNTER — Ambulatory Visit
Admission: RE | Admit: 2023-02-01 | Discharge: 2023-02-01 | Disposition: A | Discharge status: Home / Self Care | Payer: 59 | Source: Ambulatory Visit | Attending: Internal Medicine | Admitting: Internal Medicine

## 2023-02-01 DIAGNOSIS — Z136 Encounter for screening for cardiovascular disorders: Secondary | ICD-10-CM | POA: Insufficient documentation

## 2023-02-03 ENCOUNTER — Encounter: Payer: Self-pay | Admitting: *Deleted

## 2023-03-08 ENCOUNTER — Other Ambulatory Visit (INDEPENDENT_AMBULATORY_CARE_PROVIDER_SITE_OTHER): Payer: 59

## 2023-03-08 DIAGNOSIS — R739 Hyperglycemia, unspecified: Secondary | ICD-10-CM | POA: Diagnosis not present

## 2023-03-08 DIAGNOSIS — E039 Hypothyroidism, unspecified: Secondary | ICD-10-CM | POA: Diagnosis not present

## 2023-03-08 LAB — HEMOGLOBIN A1C: Hgb A1c MFr Bld: 5.8 % (ref 4.6–6.5)

## 2023-03-08 NOTE — Addendum Note (Signed)
Addended by: Jarvis Morgan D on: 03/08/2023 08:38 AM   Modules accepted: Orders

## 2023-03-09 ENCOUNTER — Telehealth: Payer: Self-pay

## 2023-03-09 DIAGNOSIS — E039 Hypothyroidism, unspecified: Secondary | ICD-10-CM

## 2023-03-09 LAB — GLUCOSE, FASTING: Glucose, Bld: 96 mg/dL (ref 65–99)

## 2023-03-09 LAB — TSH: TSH: 18.64 m[IU]/L — ABNORMAL HIGH (ref 0.40–4.50)

## 2023-03-09 NOTE — Telephone Encounter (Signed)
Lvm for pt to give office a call

## 2023-03-09 NOTE — Telephone Encounter (Signed)
-----   Message from [place] sent at 03/09/2023  4:08 AM EDT ----- Notify - TSH remains elevated.  Continuing to improve, but still elevated.  Confirm on synthroid q day.  If so, then increase synthroid to q day.  Recheck tsh in 6 weeks.  Overall sugar control is slightly elevated.  Recommend low carb diet and exercise.  We will follow.

## 2023-03-10 NOTE — Telephone Encounter (Signed)
Patient states he is returning our call.  I read Dr. Westley Hummer Scott's message to patient.  Patient states he has been taking the synthroid 125 mcg q day.  Patient states he would like to know if we have called in the prescription for the synthroid, 150 mcg q day. I let him know that I will send a message to Dr. Roby Lofts nurse so she can call it in, I believe they were waiting for an answer to their question.  I have scheduled patient for a TSH recheck on 04/22/2023.  Patient states his preferred pharmacy is [facility] on [address] in [place].

## 2023-03-11 MED ORDER — LEVOTHYROXINE SODIUM 150 MCG PO TABS: 150.0000 ug | ORAL_TABLET | Freq: Every day | ORAL | 1 refills | Status: DC

## 2023-03-11 NOTE — Telephone Encounter (Signed)
Tsh ordered. New rx sent in

## 2023-03-11 NOTE — Addendum Note (Signed)
Addended by: Rita Ohara D on: 03/11/2023 07:28 AM   Modules accepted: Orders

## 2023-04-22 ENCOUNTER — Other Ambulatory Visit (INDEPENDENT_AMBULATORY_CARE_PROVIDER_SITE_OTHER): Payer: 59

## 2023-04-22 DIAGNOSIS — E039 Hypothyroidism, unspecified: Secondary | ICD-10-CM | POA: Diagnosis not present

## 2023-04-22 LAB — TSH: TSH: 4.62 u[IU]/mL (ref 0.35–5.50)

## 2023-05-27 ENCOUNTER — Ambulatory Visit: Payer: 59 | Admitting: Internal Medicine

## 2023-05-27 VITALS — BP 128/74 | HR 76 | Temp 98.2°F | Resp 16 | Ht 68.0 in | Wt 262.6 lb

## 2023-05-27 DIAGNOSIS — R7989 Other specified abnormal findings of blood chemistry: Secondary | ICD-10-CM

## 2023-05-27 DIAGNOSIS — I77810 Thoracic aortic ectasia: Secondary | ICD-10-CM

## 2023-05-27 DIAGNOSIS — E039 Hypothyroidism, unspecified: Secondary | ICD-10-CM | POA: Diagnosis not present

## 2023-05-27 DIAGNOSIS — R931 Abnormal findings on diagnostic imaging of heart and coronary circulation: Secondary | ICD-10-CM

## 2023-05-27 DIAGNOSIS — E7800 Pure hypercholesterolemia, unspecified: Secondary | ICD-10-CM | POA: Diagnosis not present

## 2023-05-27 DIAGNOSIS — G479 Sleep disorder, unspecified: Secondary | ICD-10-CM | POA: Diagnosis not present

## 2023-05-27 DIAGNOSIS — R739 Hyperglycemia, unspecified: Secondary | ICD-10-CM | POA: Insufficient documentation

## 2023-05-27 DIAGNOSIS — Z1211 Encounter for screening for malignant neoplasm of colon: Secondary | ICD-10-CM

## 2023-05-27 NOTE — Progress Notes (Unsigned)
Subjective:    Patient ID: Juan Hernandez, male    DOB: Dec 10, 1960, 63 y.o.   MRN: [id number]  Patient here for  Chief Complaint  Patient presents with  . Medical Management of Chronic Issues    HPI Here for a scheduled follow up - here for a scheduled follow up - f/u regarding gout, hypothyroidism and hypercholesterolemia. Recent calcium score - 153. Discussed starting statin medication and aspirin. 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 Recommend annual imaging followup by CTA or MRA.   Past Medical History:  Diagnosis Date  . Arthritis   . History of gout   . Thyroid disease    Past Surgical History:  Procedure Laterality Date  . FRACTURE SURGERY  1983   Left ankle compound fracture   Family History  Problem Relation Age of Onset  . COPD Mother   . Diabetes Mother   . Arthritis Father   . Obesity Father    Social History   Socioeconomic History  . Marital status: Married    Spouse name: Not on file  . Number of children: Not on file  . Years of education: Not on file  . Highest education level: Not on file  Occupational History  . Not on file  Tobacco Use  . Smoking status: Never  . Smokeless tobacco: Never  Substance and Sexual Activity  . Alcohol use: Yes    Alcohol/week: 6.0 standard drinks of alcohol    Types: 2 Glasses of wine, 2 Cans of beer, 2 Shots of liquor per week  . Drug use: Not Currently  . Sexual activity: Not Currently  Other Topics Concern  . Not on file  Social History Narrative  . Not on file   Social Drivers of Health   Financial Resource Strain: Not on file  Food Insecurity: Not on file  Transportation Needs: Not on file  Physical Activity: Not on file  Stress: Not on file  Social Connections: Not on file     Review of Systems     Objective:     BP 128/74   Pulse 76   Temp 98.2 F (36.8 C)   Resp 16   Ht 5\' 8"  (1.727 m)   Wt 262 lb 9.6 oz (119.1 kg)   SpO2 98%   BMI  39.93 kg/m  Wt Readings from Last 3 Encounters:  05/27/23 262 lb 9.6 oz (119.1 kg)  01/25/23 270 lb 3.2 oz (122.6 kg)  12/07/22 269 lb (122 kg)    Physical Exam  {Perform Simple Foot Exam  Perform Detailed exam:1} {Insert foot Exam (Optional):30965}   Outpatient Encounter Medications as of 05/27/2023  Medication Sig  . colchicine 0.6 MG tablet Take 1 tablet (0.6 mg total) by mouth 2 (two) times daily as needed.  Marland Kitchen levothyroxine (SYNTHROID) 150 MCG tablet Take 1 tablet (150 mcg total) by mouth daily.   No facility-administered encounter medications on file as of 05/27/2023.     Lab Results  Component Value Date   WBC 7.2 10/11/2022   HGB 14.5 10/11/2022   HCT 42.8 10/11/2022   PLT 207.0 10/11/2022   GLUCOSE 96 03/08/2023   CHOL 207 (H) 01/25/2023   TRIG 174.0 (H) 01/25/2023   HDL 37.10 (L) 01/25/2023   LDLCALC 135 (H) 01/25/2023   ALT 37 01/25/2023   AST 34 01/25/2023   NA 137 01/25/2023   K 4.5 01/25/2023   CL 103 01/25/2023   CREATININE 1.09 01/25/2023  BUN 18 01/25/2023   CO2 27 01/25/2023   TSH 4.62 04/22/2023   PSA 0.17 10/11/2022   HGBA1C 5.8 03/08/2023    CT CARDIAC SCORING (SELF PAY ONLY) Addendum Date: 02/14/2023 ADDENDUM REPORT: 02/14/2023 08:34 EXAM: OVER-READ INTERPRETATION  CT CHEST The following report is an over-read performed by radiologist Dr. Royal Piedra [facility] on 02/14/2023. This over-read does not include interpretation of cardiac or coronary anatomy or pathology. The coronary calcium score interpretation by the cardiologist is attached. COMPARISON:  None. 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 Within the visualized portions of the thorax there are no suspicious appearing pulmonary nodules or masses, there is no acute consolidative airspace disease, no pleural effusions, no pneumothorax and no lymphadenopathy. Visualized portions of the upper abdomen are unremarkable. There are no aggressive appearing  lytic or blastic lesions noted in the visualized portions of the skeleton. IMPRESSION: 1. 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 Recommend annual imaging followup by CTA or MRA. This recommendation follows 2010 ACCF/AHA/AATS/ACR/ASA/SCA/SCAI/SIR/STS/SVM Guidelines for the Diagnosis and Management of Patients with Thoracic Aortic Disease. Circulation. 2010; 121: I696-E952. Aortic aneurysm NOS (ICD10-I71.9). 2. Hepatic steatosis. Electronically Signed   By: Trudie Reed M.D.   On: 02/14/2023 08:34   Result Date: 02/14/2023 CLINICAL DATA:  Risk stratification EXAM: Coronary Calcium Score TECHNIQUE: The patient was scanned on a Siemens Somatom scanner. Axial non-contrast 3 mm slices were carried out through the heart. The data set was analyzed on a dedicated work station and scored using the Agatson method. FINDINGS: Non-cardiac: See separate report from [facility]. Ascending Aorta: Normal size Pericardium: Normal Coronary arteries: Normal origin of left and right coronary arteries. Distribution of arterial calcifications if present, as noted below; LM 0 LAD 153 LCx 0 RCA 0 Total 153 IMPRESSION AND RECOMMENDATION: 1. Coronary calcium score of 153. This was 72nd percentile for age and sex matched control. 2. CAC 100-299 in LAD. CAC-DRS A2/N1. 3. Recommend aspirin and statin if no contraindication. 4. Continue heart healthy lifestyle and risk factor modification. Electronically Signed: By: Debbe Odea M.D. On: 02/02/2023 09:15       Assessment & Plan:  Hypothyroidism, unspecified type  Hypercholesterolemia  Hyperglycemia     Dale [place], MD

## 2023-05-29 ENCOUNTER — Encounter: Payer: Self-pay | Admitting: Internal Medicine

## 2023-05-29 DIAGNOSIS — G479 Sleep disorder, unspecified: Secondary | ICD-10-CM | POA: Insufficient documentation

## 2023-05-29 DIAGNOSIS — I77810 Thoracic aortic ectasia: Secondary | ICD-10-CM | POA: Insufficient documentation

## 2023-05-29 NOTE — Assessment & Plan Note (Signed)
On synthroid.  Follow tsh.   

## 2023-05-29 NOTE — Assessment & Plan Note (Signed)
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 Recommend annual imaging followup by CTA or MRA.

## 2023-05-29 NOTE — Assessment & Plan Note (Signed)
Have discussed possible fatty liver.  Diet and exercise.  Last liver panel wnl.

## 2023-05-29 NOTE — Assessment & Plan Note (Signed)
Recent calcium score 153. Recommended daily aspirin and statin medication. Wants to hold on statin medication. Follow. Continue risk factor modification.

## 2023-05-29 NOTE — Assessment & Plan Note (Signed)
Low-carb diet and exercise.  Follow met b and A1c. ?

## 2023-05-29 NOTE — Assessment & Plan Note (Signed)
Discussed calcium score and recommendation to start statin medication.  Desires to hold on starting at this time. Low cholesterol diet and exercise.  Follow lipid panel.

## 2023-05-29 NOTE — Assessment & Plan Note (Signed)
Wakes up one time per week around 2:00 as outlined.  Discussed.  No increased nocturia. Feels rested. Discussed melatonin.  Follow.

## 2023-05-29 NOTE — Assessment & Plan Note (Signed)
Last colonoscopy age 63.  Due f/u colonoscopy. Referral placed. Wants to hold.  Will notify me when agreeable.

## 2023-07-08 ENCOUNTER — Other Ambulatory Visit (INDEPENDENT_AMBULATORY_CARE_PROVIDER_SITE_OTHER): Payer: 59

## 2023-07-08 DIAGNOSIS — E7800 Pure hypercholesterolemia, unspecified: Secondary | ICD-10-CM

## 2023-07-08 DIAGNOSIS — E039 Hypothyroidism, unspecified: Secondary | ICD-10-CM

## 2023-07-08 DIAGNOSIS — R739 Hyperglycemia, unspecified: Secondary | ICD-10-CM

## 2023-07-08 LAB — BASIC METABOLIC PANEL
BUN: 21 mg/dL (ref 6–23)
CO2: 24 meq/L (ref 19–32)
Calcium: 9.3 mg/dL (ref 8.4–10.5)
Chloride: 104 meq/L (ref 96–112)
Creatinine, Ser: 1.03 mg/dL (ref 0.40–1.50)
GFR: 78.28 mL/min (ref >60.00)
Glucose, Bld: 103 mg/dL — ABNORMAL HIGH (ref 70–99)
Potassium: 4.2 meq/L (ref 3.5–5.1)
Sodium: 137 meq/L (ref 135–145)

## 2023-07-08 LAB — LIPID PANEL
Cholesterol: 205 mg/dL — ABNORMAL HIGH (ref 0–200)
HDL: 32 mg/dL — ABNORMAL LOW (ref >39.00)
LDL Cholesterol: 152 mg/dL — ABNORMAL HIGH (ref 0–99)
NonHDL: 173.49
Total CHOL/HDL Ratio: 6
Triglycerides: 105 mg/dL (ref 0.0–149.0)
VLDL: 21 mg/dL (ref 0.0–40.0)

## 2023-07-08 LAB — HEPATIC FUNCTION PANEL
ALT: 51 U/L (ref 0–53)
AST: 41 U/L — ABNORMAL HIGH (ref 0–37)
Albumin: 4.2 g/dL (ref 3.5–5.2)
Alkaline Phosphatase: 48 U/L (ref 39–117)
Bilirubin, Direct: 0.1 mg/dL (ref 0.0–0.3)
Total Bilirubin: 0.5 mg/dL (ref 0.2–1.2)
Total Protein: 7.9 g/dL (ref 6.0–8.3)

## 2023-07-08 LAB — HEMOGLOBIN A1C: Hgb A1c MFr Bld: 5.6 % (ref 4.6–6.5)

## 2023-07-08 LAB — TSH: TSH: 4.63 u[IU]/mL (ref 0.35–5.50)

## 2023-07-21 ENCOUNTER — Telehealth: Payer: Self-pay

## 2023-07-21 DIAGNOSIS — E7800 Pure hypercholesterolemia, unspecified: Secondary | ICD-10-CM

## 2023-07-21 MED ORDER — ROSUVASTATIN CALCIUM 10 MG PO TABS: 10.0000 mg | ORAL_TABLET | Freq: Every day | ORAL | 2 refills | Status: DC

## 2023-07-21 NOTE — Telephone Encounter (Signed)
 Copied from CRM [id number]. Topic: General - Other >> Jul 21, 2023 10:04 AM Eunice Blase wrote: Reason for CRM: Pt called for lab results which were provided, pt agreed to starting Crestor 10 mg and will schedule appt 6 weeks later to check liver panel.

## 2023-07-21 NOTE — Telephone Encounter (Signed)
 Rx sent in for crestor. Order placed for liver panel. See if has lab appt scheduled. If he prefers, can check lab at his next appt.

## 2023-07-21 NOTE — Addendum Note (Signed)
 Addended by: Charm Barges on: 07/21/2023 02:24 PM   Modules accepted: Orders

## 2023-07-21 NOTE — Telephone Encounter (Signed)
 Noted.

## 2023-09-01 ENCOUNTER — Other Ambulatory Visit

## 2023-09-13 ENCOUNTER — Telehealth: Payer: Self-pay

## 2023-09-13 NOTE — Telephone Encounter (Signed)
 Copied from CRM [id number]. Topic: Clinical - Request for Lab/Test Order >> Sep 13, 2023  2:44 PM Aisha D wrote: Reason for CRM: Patient is calling to see if he can get scheduled for a lab appt to do blood work. Patient stated that Dr.Scott advised him to get labs done but he hasn't been contacted to schedule an appointment.

## 2023-09-13 NOTE — Telephone Encounter (Signed)
 Please call and schedule pt for a follow up and a fasting lab appt after 10/05/23. I will order labs once scheduled.

## 2023-09-14 ENCOUNTER — Telehealth: Payer: Self-pay | Admitting: Internal Medicine

## 2023-09-14 NOTE — Telephone Encounter (Signed)
 Good afternoon,  The office received a message that Dr Geralyn Knee would like you to be seen for a follow-up visit and fasting labs after 5/28. We did attempt to call, but we were unable to leave a message as your voicemail is full. Please call us  back at [phone] to schedule these visits.   Thank you  Message sent via MyChart, although he has not been active there since 06/22/23. Pt's voicemail is full.   E2C2, please schedule these visits if pt calls back. [facility]

## 2023-09-15 NOTE — Telephone Encounter (Signed)
 He can keep his 5/16 appt if he would like but it will be too early for labs. He cannot have fasting labs until after 10/05/23.

## 2023-09-16 ENCOUNTER — Encounter (HOSPITAL_COMMUNITY): Payer: Self-pay

## 2023-09-17 ENCOUNTER — Other Ambulatory Visit: Payer: Self-pay | Admitting: Internal Medicine

## 2023-09-19 NOTE — Telephone Encounter (Signed)
 Noted.

## 2023-09-23 ENCOUNTER — Ambulatory Visit: Admitting: Internal Medicine

## 2023-09-26 ENCOUNTER — Ambulatory Visit: Payer: 59 | Admitting: Internal Medicine

## 2023-10-19 ENCOUNTER — Other Ambulatory Visit (INDEPENDENT_AMBULATORY_CARE_PROVIDER_SITE_OTHER)

## 2023-10-19 ENCOUNTER — Other Ambulatory Visit: Payer: Self-pay

## 2023-10-19 DIAGNOSIS — E7800 Pure hypercholesterolemia, unspecified: Secondary | ICD-10-CM | POA: Diagnosis not present

## 2023-10-19 DIAGNOSIS — R739 Hyperglycemia, unspecified: Secondary | ICD-10-CM

## 2023-10-19 NOTE — Addendum Note (Signed)
 Addended by: Thressa Flora D on: 10/19/2023 08:47 AM   Modules accepted: Orders

## 2023-10-19 NOTE — Addendum Note (Signed)
 Addended by: Gradie Lawless A on: 10/19/2023 08:42 AM   Modules accepted: Orders

## 2023-10-20 ENCOUNTER — Other Ambulatory Visit: Payer: Self-pay | Admitting: Internal Medicine

## 2023-10-20 ENCOUNTER — Ambulatory Visit: Payer: Self-pay | Admitting: Internal Medicine

## 2023-10-20 LAB — CBC WITH DIFFERENTIAL/PLATELET
Basophils Absolute: 0.1 10*3/uL (ref 0.0–0.2)
Basos: 2 %
EOS (ABSOLUTE): 0.3 10*3/uL (ref 0.0–0.4)
Eos: 4 %
Hematocrit: 44.8 % (ref 37.5–51.0)
Hemoglobin: 14.3 g/dL (ref 13.0–17.7)
Immature Grans (Abs): 0 10*3/uL (ref 0.0–0.1)
Immature Granulocytes: 0 %
Lymphocytes Absolute: 1.9 10*3/uL (ref 0.7–3.1)
Lymphs: 28 %
MCH: 30.2 pg (ref 26.6–33.0)
MCHC: 31.9 g/dL (ref 31.5–35.7)
MCV: 95 fL (ref 79–97)
Monocytes Absolute: 0.7 10*3/uL (ref 0.1–0.9)
Monocytes: 10 %
Neutrophils Absolute: 3.9 10*3/uL (ref 1.4–7.0)
Neutrophils: 56 %
Platelets: 187 10*3/uL (ref 150–450)
RBC: 4.74 x10E6/uL (ref 4.14–5.80)
RDW: 13.3 % (ref 11.6–15.4)
WBC: 6.8 10*3/uL (ref 3.4–10.8)

## 2023-10-20 LAB — HEPATIC FUNCTION PANEL
ALT: 19 IU/L (ref 0–44)
AST: 27 IU/L (ref 0–40)
Albumin: 4.1 g/dL (ref 3.9–4.9)
Alkaline Phosphatase: 70 IU/L (ref 44–121)
Bilirubin Total: 0.5 mg/dL (ref 0.0–1.2)
Bilirubin, Direct: 0.16 mg/dL (ref 0.00–0.40)
Total Protein: 7.2 g/dL (ref 6.0–8.5)

## 2023-10-20 LAB — LIPID PANEL
Chol/HDL Ratio: 3.1 ratio (ref 0.0–5.0)
Cholesterol, Total: 127 mg/dL (ref 100–199)
HDL: 41 mg/dL (ref >39)
LDL Chol Calc (NIH): 72 mg/dL (ref 0–99)
Triglycerides: 68 mg/dL (ref 0–149)
VLDL Cholesterol Cal: 14 mg/dL (ref 5–40)

## 2023-10-20 LAB — BASIC METABOLIC PANEL WITH GFR
BUN/Creatinine Ratio: 19 (ref 10–24)
BUN: 20 mg/dL (ref 8–27)
CO2: 20 mmol/L (ref 20–29)
Calcium: 9.3 mg/dL (ref 8.6–10.2)
Chloride: 105 mmol/L (ref 96–106)
Creatinine, Ser: 1.03 mg/dL (ref 0.76–1.27)
Glucose: 94 mg/dL (ref 70–99)
Potassium: 4.4 mmol/L (ref 3.5–5.2)
Sodium: 141 mmol/L (ref 134–144)
eGFR: 82 mL/min/{1.73_m2} (ref >59)

## 2023-10-20 LAB — HEMOGLOBIN A1C
Est. average glucose Bld gHb Est-mCnc: 103 mg/dL
Hgb A1c MFr Bld: 5.2 % (ref 4.8–5.6)

## 2023-10-21 ENCOUNTER — Ambulatory Visit: Admitting: Internal Medicine

## 2023-10-21 ENCOUNTER — Encounter: Payer: Self-pay | Admitting: Internal Medicine

## 2023-10-21 VITALS — BP 112/70 | HR 67 | Temp 98.0°F | Resp 16 | Ht 68.0 in | Wt 244.0 lb

## 2023-10-21 DIAGNOSIS — Z125 Encounter for screening for malignant neoplasm of prostate: Secondary | ICD-10-CM

## 2023-10-21 DIAGNOSIS — E7800 Pure hypercholesterolemia, unspecified: Secondary | ICD-10-CM

## 2023-10-21 DIAGNOSIS — G479 Sleep disorder, unspecified: Secondary | ICD-10-CM

## 2023-10-21 DIAGNOSIS — E039 Hypothyroidism, unspecified: Secondary | ICD-10-CM

## 2023-10-21 DIAGNOSIS — I77810 Thoracic aortic ectasia: Secondary | ICD-10-CM

## 2023-10-21 DIAGNOSIS — Z1211 Encounter for screening for malignant neoplasm of colon: Secondary | ICD-10-CM

## 2023-10-21 DIAGNOSIS — M109 Gout, unspecified: Secondary | ICD-10-CM | POA: Diagnosis not present

## 2023-10-21 DIAGNOSIS — R739 Hyperglycemia, unspecified: Secondary | ICD-10-CM | POA: Diagnosis not present

## 2023-10-21 MED ORDER — ALLOPURINOL 300 MG PO TABS: 300.0000 mg | ORAL_TABLET | Freq: Every day | ORAL | 6 refills | Status: DC

## 2023-10-21 MED ORDER — ROSUVASTATIN CALCIUM 10 MG PO TABS: 10.0000 mg | ORAL_TABLET | Freq: Every day | ORAL | 3 refills | Status: DC

## 2023-10-21 MED ORDER — COLCHICINE 0.6 MG PO TABS: 0.6000 mg | ORAL_TABLET | Freq: Two times a day (BID) | ORAL | 0 refills | Status: DC | PRN

## 2023-10-21 NOTE — Progress Notes (Signed)
 Subjective:    Patient ID: Juan Hernandez, male    DOB: January 10, 1961, 63 y.o.   MRN: [id number]  Patient here for  Chief Complaint  Patient presents with   Medical Management of Chronic Issues    HPI Here for a scheduled follow up -  f/u regarding gout, hypothyroidism and hypercholesterolemia. Recent calcium  score - 153. Discussed starting statin medication and aspirin. Desires not to start statin at this time. 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 Recommend annual imaging followup by CTA or MRA. No chest pain or sob reported.  No abdominal pain or bowel changes reported did discuss that he was due for a colonoscopy.  He is agreeable for referral.  Has adjusted his diet.  Has been a [employer] fast streamer diet.  He has lost weight. 2.  Does report recent gout flares.  Has had probably 4 flares in the last 6 months.  Has been taking colchicine .  Discussed preventative medication.   Past Medical History:  Diagnosis Date   Arthritis    History of gout    Thyroid  disease    Past Surgical History:  Procedure Laterality Date   FRACTURE SURGERY  1983   Left ankle compound fracture   Family History  Problem Relation Age of Onset   COPD Mother    Diabetes Mother    Arthritis Father    Obesity Father    Social History   Socioeconomic History   Marital status: Married    Spouse name: Not on file   Number of children: Not on file   Years of education: Not on file   Highest education level: Not on file  Occupational History   Not on file  Tobacco Use   Smoking status: Never   Smokeless tobacco: Never  Substance and Sexual Activity   Alcohol use: Yes    Alcohol/week: 6.0 standard drinks of alcohol    Types: 2 Glasses of wine, 2 Cans of beer, 2 Shots of liquor per week   Drug use: Not Currently   Sexual activity: Not Currently  Other Topics Concern   Not on file  Social History Narrative   Not on file   Social Drivers of Health   Financial  Resource Strain: Not on file  Food Insecurity: Not on file  Transportation Needs: Not on file  Physical Activity: Not on file  Stress: Not on file  Social Connections: Not on file     Review of Systems  Constitutional:  Negative for appetite change and unexpected weight change.  HENT:  Negative for congestion and sinus pressure.   Respiratory:  Negative for cough, chest tightness and shortness of breath.   Cardiovascular:  Negative for chest pain, palpitations and leg swelling.  Gastrointestinal:  Negative for abdominal pain, diarrhea, nausea and vomiting.  Genitourinary:  Negative for difficulty urinating and dysuria.  Musculoskeletal:  Negative for joint swelling and myalgias.  Skin:  Negative for color change and rash.  Neurological:  Negative for dizziness and headaches.  Psychiatric/Behavioral:  Negative for agitation and dysphoric mood.        Discussed his sleep issues. Some trouble staying asleep. Does feel rested in am. No snoring reported. Has tried melatonin       Objective:     BP 112/70   Pulse 67   Temp 98 F (36.7 C)   Resp 16   Ht 5' 8 (1.727 m)   Wt 244 lb (110.7 kg)   SpO2 98%  BMI 37.10 kg/m  Wt Readings from Last 3 Encounters:  10/21/23 244 lb (110.7 kg)  05/27/23 262 lb 9.6 oz (119.1 kg)  01/25/23 270 lb 3.2 oz (122.6 kg)    Physical Exam Vitals reviewed.  Constitutional:      General: He is not in acute distress.    Appearance: Normal appearance. He is well-developed.  HENT:     Head: Normocephalic and atraumatic.     Right Ear: External ear normal.     Left Ear: External ear normal.     Mouth/Throat:     Pharynx: No oropharyngeal exudate or posterior oropharyngeal erythema.   Eyes:     General: No scleral icterus.       Right eye: No discharge.        Left eye: No discharge.     Conjunctiva/sclera: Conjunctivae normal.    Cardiovascular:     Rate and Rhythm: Normal rate and regular rhythm.  Pulmonary:     Effort: Pulmonary  effort is normal. No respiratory distress.     Breath sounds: Normal breath sounds.  Abdominal:     General: Bowel sounds are normal.     Palpations: Abdomen is soft.     Tenderness: There is no abdominal tenderness.   Musculoskeletal:        General: No swelling or tenderness.     Cervical back: Neck supple. No tenderness.  Lymphadenopathy:     Cervical: No cervical adenopathy.   Skin:    Findings: No erythema or rash.   Neurological:     Mental Status: He is alert.   Psychiatric:        Mood and Affect: Mood normal.        Behavior: Behavior normal.         Outpatient Encounter Medications as of 10/21/2023  Medication Sig   allopurinol (ZYLOPRIM) 300 MG tablet Take 1 tablet (300 mg total) by mouth daily.   levothyroxine  (SYNTHROID ) 150 MCG tablet TAKE 1 TABLET BY MOUTH EVERY DAY   [DISCONTINUED] colchicine  0.6 MG tablet Take 1 tablet (0.6 mg total) by mouth 2 (two) times daily as needed.   [DISCONTINUED] rosuvastatin  (CRESTOR ) 10 MG tablet TAKE 1 TABLET BY MOUTH EVERY DAY   colchicine  0.6 MG tablet Take 1 tablet (0.6 mg total) by mouth 2 (two) times daily as needed.   rosuvastatin  (CRESTOR ) 10 MG tablet Take 1 tablet (10 mg total) by mouth daily.   No facility-administered encounter medications on file as of 10/21/2023.     Lab Results  Component Value Date   WBC 6.8 10/19/2023   HGB 14.3 10/19/2023   HCT 44.8 10/19/2023   PLT 187 10/19/2023   GLUCOSE 94 10/19/2023   CHOL 127 10/19/2023   TRIG 68 10/19/2023   HDL 41 10/19/2023   LDLCALC 72 10/19/2023   ALT 19 10/19/2023   AST 27 10/19/2023   NA 141 10/19/2023   K 4.4 10/19/2023   CL 105 10/19/2023   CREATININE 1.03 10/19/2023   BUN 20 10/19/2023   CO2 20 10/19/2023   TSH 4.63 07/08/2023   PSA 0.17 10/11/2022   HGBA1C 5.2 10/19/2023    CT CARDIAC SCORING (SELF PAY ONLY) Addendum Date: 02/14/2023 ADDENDUM REPORT: 02/14/2023 08:34 EXAM: OVER-READ INTERPRETATION  CT CHEST The following report is an  over-read performed by radiologist Dr. Babs Bolster [facility] on 02/14/2023. This over-read does not include interpretation of cardiac or coronary anatomy or pathology. The coronary calcium  score interpretation by the cardiologist is  attached. COMPARISON:  None. 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 Within the visualized portions of the thorax there are no suspicious appearing pulmonary nodules or masses, there is no acute consolidative airspace disease, no pleural effusions, no pneumothorax and no lymphadenopathy. Visualized portions of the upper abdomen are unremarkable. There are no aggressive appearing lytic or blastic lesions noted in the visualized portions of the skeleton. IMPRESSION: 1. 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 Recommend annual imaging followup by CTA or MRA. This recommendation follows 2010 ACCF/AHA/AATS/ACR/ASA/SCA/SCAI/SIR/STS/SVM Guidelines for the Diagnosis and Management of Patients with Thoracic Aortic Disease. Circulation. 2010; 121: Z610-R604. Aortic aneurysm NOS (ICD10-I71.9). 2. Hepatic steatosis. Electronically Signed   By: Alexandria Angel M.D.   On: 02/14/2023 08:34   Result Date: 02/14/2023 CLINICAL DATA:  Risk stratification EXAM: Coronary Calcium  Score TECHNIQUE: The patient was scanned on a Siemens Somatom scanner. Axial non-contrast 3 mm slices were carried out through the heart. The data set was analyzed on a dedicated work station and scored using the Agatson method. FINDINGS: Non-cardiac: See separate report from [facility]. Ascending Aorta: Normal size Pericardium: Normal Coronary arteries: Normal origin of left and right coronary arteries. Distribution of arterial calcifications if present, as noted below; LM 0 LAD 153 LCx 0 RCA 0 Total 153 IMPRESSION AND RECOMMENDATION: 1. Coronary calcium  score of 153. This was 72nd percentile for age and sex matched control. 2. CAC 100-299 in LAD. CAC-DRS  A2/N1. 3. Recommend aspirin and statin if no contraindication. 4. Continue heart healthy lifestyle and risk factor modification. Electronically Signed: By: Constancia Delton M.D. On: 02/02/2023 09:15       Assessment & Plan:  Prostate cancer screening -     PSA; Future  Hyperglycemia Assessment & Plan: Low carb diet and exercise.  Follow Mcvey and A1c.  Orders: -     Hemoglobin A1c; Future  Hypercholesterolemia Assessment & Plan: Have discussed calcium  score and recommendation to start statin medication. Has desired to hold on starting cholesterol medication. Low cholesterol diet and exercise.  Follow lipid panel.   Orders: -     Basic metabolic panel with GFR; Future -     Hepatic function panel; Future -     Lipid panel; Future  Colon cancer screening Assessment & Plan: Discussed due colonoscopy.  Agreeable for referral.  Order for referral placed.  Orders: -     Ambulatory referral to Gastroenterology  Gout, unspecified cause, unspecified chronicity, unspecified site Assessment & Plan: Recent flares as outlined.  Refilled colchicine  to have as needed.  Given more recent flares, will start allopurinol.   Hypothyroidism, unspecified type Assessment & Plan: On thyroid  replacement.  Follow TSH.   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 Recommend annual imaging followup by CTA or MRA.    Sleep disturbance Assessment & Plan: Trial of magnesium glycinate.  Discussed behavior modification.   Other orders -     Colchicine ; Take 1 tablet (0.6 mg total) by mouth 2 (two) times daily as needed.  Dispense: 60 tablet; Refill: 0 -     Rosuvastatin  Calcium ; Take 1 tablet (10 mg total) by mouth daily.  Dispense: 90 tablet; Refill: 3 -     Allopurinol; Take 1 tablet (300 mg total) by mouth daily.  Dispense: 30 tablet; Refill: 6     Dellar Fenton, MD

## 2023-10-21 NOTE — Assessment & Plan Note (Signed)
 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 Recommend annual imaging followup by CTA or MRA.

## 2023-10-21 NOTE — Assessment & Plan Note (Signed)
 Recent flares as outlined.  Refilled colchicine  to have as needed.  Given more recent flares, will start allopurinol.

## 2023-10-21 NOTE — Assessment & Plan Note (Signed)
 Trial of magnesium glycinate.  Discussed behavior modification.

## 2023-10-21 NOTE — Assessment & Plan Note (Signed)
 On thyroid replacement.  Follow TSH

## 2023-10-21 NOTE — Assessment & Plan Note (Signed)
 Discussed due colonoscopy.  Agreeable for referral.  Order for referral placed.

## 2023-10-21 NOTE — Assessment & Plan Note (Signed)
 Low-carb diet and exercise.  Follow Mcvey and A1c.

## 2023-10-21 NOTE — Assessment & Plan Note (Signed)
 Have discussed calcium  score and recommendation to start statin medication. Has desired to hold on starting cholesterol medication. Low cholesterol diet and exercise.  Follow lipid panel.

## 2023-10-21 NOTE — Patient Instructions (Signed)
 Magnesium glycinate - take one before bed.

## 2023-11-12 ENCOUNTER — Other Ambulatory Visit: Payer: Self-pay | Admitting: Internal Medicine

## 2023-12-29 ENCOUNTER — Encounter: Payer: Self-pay | Admitting: Gastroenterology

## 2023-12-29 DIAGNOSIS — Z1211 Encounter for screening for malignant neoplasm of colon: Secondary | ICD-10-CM | POA: Diagnosis not present

## 2023-12-29 NOTE — Anesthesia Preprocedure Evaluation (Addendum)
 Anesthesia Evaluation  Patient identified by MRN, date of birth, ID band Patient awake    Reviewed: Allergy & Precautions, NPO status , Patient's Chart, lab work & pertinent test results  Airway Mallampati: III  TM Distance: >3 FB Neck ROM: full    Dental  (+) Chipped   Pulmonary neg pulmonary ROS   Pulmonary exam normal        Cardiovascular negative cardio ROS Normal cardiovascular exam     Neuro/Psych negative neurological ROS  negative psych ROS   GI/Hepatic Neg liver ROS,GERD  ,,  Endo/Other  Hypothyroidism    Renal/GU negative Renal ROS  negative genitourinary   Musculoskeletal   Abdominal   Peds  Hematology negative hematology ROS (+)   Anesthesia Other Findings Past Medical History: No date: Arthritis No date: GERD (gastroesophageal reflux disease)     Comment:  OCCAISIONAL NO MEDS TAKEN No date: History of gout No date: History of kidney stones No date: Hyperthyroidism No date: Hypothyroidism No date: Thyroid  disease  Past Surgical History: No date: COLONOSCOPY; N/A 1983: FRACTURE SURGERY     Comment:  Left ankle compound fracture  BMI    Body Mass Index: 36.07 kg/m      Reproductive/Obstetrics negative OB ROS                              Anesthesia Physical Anesthesia Plan  ASA: 2  Anesthesia Plan: General   Post-op Pain Management: Minimal or no pain anticipated   Induction: Intravenous  PONV Risk Score and Plan: 2 and Propofol  infusion and TIVA  Airway Management Planned: Nasal Cannula  Additional Equipment: None  Intra-op Plan:   Post-operative Plan:   Informed Consent: I have reviewed the patients History and Physical, chart, labs and discussed the procedure including the risks, benefits and alternatives for the proposed anesthesia with the patient or authorized representative who has indicated his/her understanding and acceptance.     Dental  advisory given  Plan Discussed with: CRNA and Surgeon  Anesthesia Plan Comments: (Discussed risks of anesthesia with patient, including possibility of difficulty with spontaneous ventilation under anesthesia necessitating airway intervention, PONV, and rare risks such as cardiac or respiratory or neurological events, and allergic reactions. Discussed the role of CRNA in patient's perioperative care. Patient understands.)        Anesthesia Quick Evaluation

## 2024-01-02 ENCOUNTER — Ambulatory Visit: Payer: Self-pay | Admitting: Anesthesiology

## 2024-01-02 ENCOUNTER — Ambulatory Visit
Admission: RE | Admit: 2024-01-02 | Discharge: 2024-01-02 | Disposition: A | Discharge status: Home / Self Care | Attending: Gastroenterology | Admitting: Gastroenterology

## 2024-01-02 ENCOUNTER — Encounter
Admission: RE | Disposition: A | Discharge status: Home / Self Care | Payer: Self-pay | Source: Home / Self Care | Attending: Gastroenterology

## 2024-01-02 ENCOUNTER — Encounter: Payer: Self-pay | Admitting: Gastroenterology

## 2024-01-02 ENCOUNTER — Other Ambulatory Visit: Payer: Self-pay

## 2024-01-02 DIAGNOSIS — K644 Residual hemorrhoidal skin tags: Secondary | ICD-10-CM | POA: Diagnosis not present

## 2024-01-02 DIAGNOSIS — E039 Hypothyroidism, unspecified: Secondary | ICD-10-CM | POA: Diagnosis not present

## 2024-01-02 DIAGNOSIS — K648 Other hemorrhoids: Secondary | ICD-10-CM | POA: Diagnosis not present

## 2024-01-02 DIAGNOSIS — Z1211 Encounter for screening for malignant neoplasm of colon: Secondary | ICD-10-CM | POA: Diagnosis not present

## 2024-01-02 DIAGNOSIS — Z87891 Personal history of nicotine dependence: Secondary | ICD-10-CM | POA: Insufficient documentation

## 2024-01-02 HISTORY — DX: Hypothyroidism, unspecified: E03.9

## 2024-01-02 HISTORY — PX: COLONOSCOPY: SHX5424

## 2024-01-02 HISTORY — DX: Thyrotoxicosis, unspecified without thyrotoxic crisis or storm: E05.90

## 2024-01-02 HISTORY — DX: Personal history of urinary calculi: Z87.442

## 2024-01-02 HISTORY — DX: Gastro-esophageal reflux disease without esophagitis: K21.9

## 2024-01-02 SURGERY — COLONOSCOPY
Anesthesia: General | Site: Rectum

## 2024-01-02 MED ORDER — STERILE WATER FOR IRRIGATION IR SOLN
Status: DC | PRN
Start: 2024-01-02 — End: 2024-01-02
  Administered 2024-01-02: 500 mL

## 2024-01-02 MED ORDER — PROPOFOL 10 MG/ML IV BOLUS
INTRAVENOUS | Status: AC
  Filled 2024-01-02: qty 20

## 2024-01-02 MED ORDER — LACTATED RINGERS IV SOLN: INTRAVENOUS | Status: DC

## 2024-01-02 MED ORDER — PROPOFOL 10 MG/ML IV BOLUS
INTRAVENOUS | Status: DC | PRN
  Administered 2024-01-02: 100 mg via INTRAVENOUS
  Administered 2024-01-02: 160 ug/kg/min via INTRAVENOUS
  Administered 2024-01-02: 100 mg via INTRAVENOUS

## 2024-01-02 SURGICAL SUPPLY — 16 items
CLIP HMST 235XBRD CATH ROT (MISCELLANEOUS) IMPLANT
ELECTRODE REM PT RTRN 9FT ADLT (ELECTROSURGICAL) IMPLANT
FORCEPS BIOP RAD 4 LRG CAP 4 (CUTTING FORCEPS) IMPLANT
FORCEPS ESCP3.2XJMB 240X2.8X (MISCELLANEOUS) IMPLANT
GAUZE SPONGE 4X4 12PLY STRL (GAUZE/BANDAGES/DRESSINGS) IMPLANT
GOWN CVR UNV OPN BCK APRN NK (MISCELLANEOUS) ×2 IMPLANT
INJECTOR VARIJECT VIN23 (MISCELLANEOUS) IMPLANT
KIT DEFENDO VALVE AND CONN (KITS) IMPLANT
KIT PRC NS LF DISP ENDO (KITS) ×1 IMPLANT
MANIFOLD NEPTUNE II (INSTRUMENTS) ×1 IMPLANT
MARKER SPOT ENDO TATTOO 5ML (MISCELLANEOUS) IMPLANT
PROBE APC STR FIRE (PROBE) IMPLANT
RETRIEVER NET ROTH 2.5X230 LF (MISCELLANEOUS) IMPLANT
SNARE COLD EXACTO (MISCELLANEOUS) IMPLANT
TRAP ETRAP POLY (MISCELLANEOUS) IMPLANT
WATER STERILE IRR 250ML POUR (IV SOLUTION) ×1 IMPLANT

## 2024-01-02 NOTE — Op Note (Signed)
 [facility] Patient Name: Juan Hernandez Procedure Date: 01/02/2024 9:06 AM MRN: [id number] Account #: [id number] Date of Birth: 07-24-1960 Admit Type: Outpatient Age: 63 Room: [facility] OR ROOM 01 Gender: Male Note Status: Finalized Instrument Name: Colonoscope [id number] Procedure:             Colonoscopy Indications:           Screening for colorectal malignant neoplasm, Last                         colonoscopy: March 2015 Providers:             Corinn Jess Brooklyn MD, MD Referring MD:          Allena Hamilton, MD (Referring MD) Medicines:             General Anesthesia Complications:         No immediate complications. Estimated blood loss: None. Procedure:             Pre-Anesthesia Assessment:                        - Prior to the procedure, a History and Physical was                         performed, and patient medications and allergies were                         reviewed. The patient is competent. The risks and                         benefits of the procedure and the sedation options and                         risks were discussed with the patient. All questions                         were answered and informed consent was obtained.                         Patient identification and proposed procedure were                         verified by the physician, the nurse, the                         anesthesiologist, the anesthetist and the technician                         in the pre-procedure area in the procedure room in the                         endoscopy suite. Mental Status Examination: alert and                         oriented. Airway Examination: normal oropharyngeal                         airway and neck mobility. Respiratory Examination:  clear to auscultation. CV Examination: normal.                         Prophylactic Antibiotics: The patient does not require                         prophylactic  antibiotics. Prior Anticoagulants: The                         patient has taken no anticoagulant or antiplatelet                         agents. ASA Grade Assessment: II - A patient with mild                         systemic disease. After reviewing the risks and                         benefits, the patient was deemed in satisfactory                         condition to undergo the procedure. The anesthesia                         plan was to use general anesthesia. Immediately prior                         to administration of medications, the patient was                         re-assessed for adequacy to receive sedatives. The                         heart rate, respiratory rate, oxygen saturations,                         blood pressure, adequacy of pulmonary ventilation, and                         response to care were monitored throughout the                         procedure. The physical status of the patient was                         re-assessed after the procedure.                        After obtaining informed consent, the colonoscope was                         passed under direct vision. Throughout the procedure,                         the patient's blood pressure, pulse, and oxygen                         saturations were monitored continuously. The  Colonoscope was introduced through the anus and                         advanced to the the terminal ileum, with                         identification of the appendiceal orifice and IC                         valve. The colonoscopy was performed without                         difficulty. The patient tolerated the procedure well.                         The quality of the bowel preparation was evaluated                         using the BBPS [facility] Bowel Preparation Scale) with                         scores of: Right Colon = 3, Transverse Colon = 3 and                         Left Colon = 3 (entire  mucosa seen well with no                         residual staining, small fragments of stool or opaque                         liquid). The total BBPS score equals 9. The terminal                         ileum, ileocecal valve, appendiceal orifice, and                         rectum were photographed. Findings:      The perianal and digital rectal examinations were normal. Pertinent       negatives include normal sphincter tone and no palpable rectal lesions.      The entire examined colon appeared normal.      The terminal ileum appeared normal.      Non-bleeding external hemorrhoids were found during retroflexion. The       hemorrhoids were moderate. Impression:            - The entire examined colon is normal.                        - The examined portion of the ileum was normal.                        - Non-bleeding external hemorrhoids.                        - No specimens collected. Recommendation:        - Discharge patient to home (with escort).                        -  Resume previous diet today.                        - Continue present medications.                        - Repeat colonoscopy in 10 years for screening                         purposes. Procedure Code(s):     --- Professional ---                        H9878, Colorectal cancer screening; colonoscopy on                         individual not meeting criteria for high risk Diagnosis Code(s):     --- Professional ---                        Z12.11, Encounter for screening for malignant neoplasm                         of colon                        K64.4, Residual hemorrhoidal skin tags CPT copyright 2022 American Medical Association. All rights reserved. The codes documented in this report are preliminary and upon coder review may  be revised to meet current compliance requirements. Dr. Corinn Brooklyn Corinn Jess Brooklyn MD, MD 01/02/2024 9:32:55 AM This report has been signed electronically. Number of Addenda:  0 Note Initiated On: 01/02/2024 9:06 AM Scope Withdrawal Time: 0 hours 7 minutes 17 seconds  Total Procedure Duration: 0 hours 11 minutes 0 seconds  Estimated Blood Loss:  Estimated blood loss: none.      [facility]

## 2024-01-02 NOTE — Transfer of Care (Signed)
 Immediate Anesthesia Transfer of Care Note  Patient: Juan Hernandez  Procedure(s) Performed: COLONOSCOPY (Rectum)  Patient Location: PACU  Anesthesia Type: General  Level of Consciousness: awake, alert  and patient cooperative  Airway and Oxygen Therapy: Patient Spontanous Breathing and Patient connected to supplemental oxygen  Post-op Assessment: Post-op Vital signs reviewed, Patient's Cardiovascular Status Stable, Respiratory Function Stable, Patent Airway and No signs of Nausea or vomiting  Post-op Vital Signs: Reviewed and stable  Complications: No notable events documented.

## 2024-01-02 NOTE — Anesthesia Postprocedure Evaluation (Signed)
 Anesthesia Post Note  Patient: Domanique Luckett  Procedure(s) Performed: COLONOSCOPY (Rectum)  Patient location during evaluation: PACU Anesthesia Type: General Level of consciousness: awake and alert Pain management: pain level controlled Vital Signs Assessment: post-procedure vital signs reviewed and stable Respiratory status: spontaneous breathing, nonlabored ventilation, respiratory function stable and patient connected to nasal cannula oxygen Cardiovascular status: blood pressure returned to baseline and stable Postop Assessment: no apparent nausea or vomiting Anesthetic complications: no   No notable events documented.   Last Vitals:  Vitals:   01/02/24 0940 01/02/24 0945  BP:  107/76  Pulse: 63 63  Resp: 15 (!) 21  Temp:  36.6 C  SpO2: 94% 95%    Last Pain:  Vitals:   01/02/24 0945  TempSrc:   PainSc: 0-No pain                 Debby [place]

## 2024-01-02 NOTE — H&P (Signed)
 Juan JONELLE Brooklyn, MD [facility], DHIP [address]  [place], [place] [address]  Main: [phone] Fax:  [phone] Pager: [phone]   Primary Care Physician:  Glendia Shad, MD Primary Gastroenterologist:  Dr. Corinn JONELLE Hernandez  Pre-Procedure History & Physical: HPI:  Juan Hernandez is a 63 y.o. male is here for a colonoscopy.   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   History of gout    History of kidney stones    Hyperthyroidism    Hypothyroidism    Thyroid  disease     Past Surgical History:  Procedure Laterality Date   COLONOSCOPY N/A    FRACTURE SURGERY  1983   Left ankle compound fracture    Prior to Admission medications   Medication Sig Start Date End Date Taking? Authorizing Provider  allopurinol  (ZYLOPRIM ) 300 MG tablet Take 1 tablet (300 mg total) by mouth daily. 10/21/23  Yes Glendia Shad, MD  ibuprofen (ADVIL) 800 MG tablet Take 800 mg by mouth every 8 (eight) hours as needed for mild pain (pain score 1-3).   Yes [provider]  levothyroxine  (SYNTHROID ) 150 MCG tablet TAKE 1 TABLET BY MOUTH EVERY DAY 09/19/23  Yes Glendia Shad, MD  Magnesium 100 MG CAPS Take 1 capsule by mouth daily.   Yes [provider]  rosuvastatin  (CRESTOR ) 10 MG tablet Take 1 tablet (10 mg total) by mouth daily. 10/21/23  Yes Glendia Shad, MD  colchicine  0.6 MG tablet Take 1 tablet (0.6 mg total) by mouth 2 (two) times daily as needed. 10/21/23   Glendia Shad, MD    Allergies as of 12/29/2023 - Review Complete 12/29/2023  Allergen Reaction Noted   Aspirin Swelling 10/07/2022    Family History  Problem Relation Age of Onset   COPD Mother    Diabetes Mother    Arthritis Father    Obesity Father     Social History   Socioeconomic History   Marital status: Married    Spouse name: Not on file   Number of children: Not on file    Years of education: Not on file   Highest education level: Not on file  Occupational History   Not on file  Tobacco Use   Smoking status: Never   Smokeless tobacco: Former    Types: Designer, multimedia Use   Vaping status: Never Used  Substance and Sexual Activity   Alcohol use: Yes    Comment: occasionally will have 2 drinks of wine or beer or liquor   Drug use: Yes    Types: Marijuana    Comment: in high school but not since   Sexual activity: Not Currently  Other Topics Concern   Not on file  Social History Narrative   Not on file   Social Drivers of Health   Financial Resource Strain: Not on file  Food Insecurity: Not on file  Transportation Needs: Not on file  Physical Activity: Not on file  Stress: Not on file  Social Connections: Not on file  Intimate Partner Violence: Not on file    Review of Systems: See HPI, otherwise negative ROS  Physical Exam: BP 132/78   Temp 98.7 F (37.1 C) (Temporal)   Resp 13   Ht 5' 7.99 (1.727 m)   Wt 107.6 kg   SpO2 96%   BMI 36.07 kg/m  General:   Alert,  pleasant and cooperative  in NAD Head:  Normocephalic and atraumatic. Neck:  Supple; no masses or thyromegaly. Lungs:  Clear throughout to auscultation.    Heart:  Regular rate and rhythm. Abdomen:  Soft, nontender and nondistended. Normal bowel sounds, without guarding, and without rebound.   Neurologic:  Alert and  oriented x4;  grossly normal neurologically.  Impression/Plan: Juan Hernandez is here for an colonoscopy to be performed for colon cancer screening  Risks, benefits, limitations, and alternatives regarding  colonoscopy have been reviewed with the patient.  Questions have been answered.  All parties agreeable.   Juan Brooklyn, MD  01/02/2024, 9:07 AM

## 2024-02-20 ENCOUNTER — Other Ambulatory Visit (INDEPENDENT_AMBULATORY_CARE_PROVIDER_SITE_OTHER)

## 2024-02-20 ENCOUNTER — Ambulatory Visit: Payer: Self-pay | Admitting: Internal Medicine

## 2024-02-20 DIAGNOSIS — R739 Hyperglycemia, unspecified: Secondary | ICD-10-CM

## 2024-02-20 DIAGNOSIS — E7800 Pure hypercholesterolemia, unspecified: Secondary | ICD-10-CM | POA: Diagnosis not present

## 2024-02-20 DIAGNOSIS — Z125 Encounter for screening for malignant neoplasm of prostate: Secondary | ICD-10-CM | POA: Diagnosis not present

## 2024-02-20 LAB — BASIC METABOLIC PANEL WITH GFR
BUN: 21 mg/dL (ref 6–23)
CO2: 27 meq/L (ref 19–32)
Calcium: 9.1 mg/dL (ref 8.4–10.5)
Chloride: 102 meq/L (ref 96–112)
Creatinine, Ser: 0.96 mg/dL (ref 0.40–1.50)
GFR: 84.81 mL/min (ref >60.00)
Glucose, Bld: 107 mg/dL — ABNORMAL HIGH (ref 70–99)
Potassium: 4.7 meq/L (ref 3.5–5.1)
Sodium: 138 meq/L (ref 135–145)

## 2024-02-20 LAB — LIPID PANEL
Cholesterol: 134 mg/dL (ref 0–200)
HDL: 38.7 mg/dL — ABNORMAL LOW (ref >39.00)
LDL Cholesterol: 81 mg/dL (ref 0–99)
NonHDL: 94.91
Total CHOL/HDL Ratio: 3
Triglycerides: 69 mg/dL (ref 0.0–149.0)
VLDL: 13.8 mg/dL (ref 0.0–40.0)

## 2024-02-20 LAB — PSA: PSA: 0.27 ng/mL (ref 0.10–4.00)

## 2024-02-20 LAB — HEPATIC FUNCTION PANEL
ALT: 25 U/L (ref 0–53)
AST: 26 U/L (ref 0–37)
Albumin: 4.4 g/dL (ref 3.5–5.2)
Alkaline Phosphatase: 54 U/L (ref 39–117)
Bilirubin, Direct: 0.1 mg/dL (ref 0.0–0.3)
Total Bilirubin: 0.5 mg/dL (ref 0.2–1.2)
Total Protein: 7.3 g/dL (ref 6.0–8.3)

## 2024-02-20 LAB — HEMOGLOBIN A1C: Hgb A1c MFr Bld: 5.6 % (ref 4.6–6.5)

## 2024-02-22 ENCOUNTER — Other Ambulatory Visit: Payer: Self-pay | Admitting: *Deleted

## 2024-02-22 DIAGNOSIS — E7800 Pure hypercholesterolemia, unspecified: Secondary | ICD-10-CM

## 2024-02-22 DIAGNOSIS — R739 Hyperglycemia, unspecified: Secondary | ICD-10-CM

## 2024-02-23 ENCOUNTER — Ambulatory Visit: Admitting: Internal Medicine

## 2024-02-23 ENCOUNTER — Encounter: Payer: Self-pay | Admitting: Internal Medicine

## 2024-02-23 VITALS — BP 104/70 | HR 63 | Temp 97.5°F | Ht 68.0 in | Wt 249.5 lb

## 2024-02-23 DIAGNOSIS — Z23 Encounter for immunization: Secondary | ICD-10-CM

## 2024-02-23 DIAGNOSIS — G479 Sleep disorder, unspecified: Secondary | ICD-10-CM

## 2024-02-23 DIAGNOSIS — E7800 Pure hypercholesterolemia, unspecified: Secondary | ICD-10-CM | POA: Diagnosis not present

## 2024-02-23 DIAGNOSIS — R739 Hyperglycemia, unspecified: Secondary | ICD-10-CM

## 2024-02-23 DIAGNOSIS — M109 Gout, unspecified: Secondary | ICD-10-CM

## 2024-02-23 DIAGNOSIS — R931 Abnormal findings on diagnostic imaging of heart and coronary circulation: Secondary | ICD-10-CM

## 2024-02-23 DIAGNOSIS — Z0000 Encounter for general adult medical examination without abnormal findings: Secondary | ICD-10-CM | POA: Diagnosis not present

## 2024-02-23 DIAGNOSIS — E039 Hypothyroidism, unspecified: Secondary | ICD-10-CM | POA: Diagnosis not present

## 2024-02-23 DIAGNOSIS — I77810 Thoracic aortic ectasia: Secondary | ICD-10-CM | POA: Diagnosis not present

## 2024-02-23 MED ORDER — ROSUVASTATIN CALCIUM 20 MG PO TABS: 20.0000 mg | ORAL_TABLET | Freq: Every day | ORAL | 1 refills | Status: DC

## 2024-02-23 MED ORDER — LEVOTHYROXINE SODIUM 150 MCG PO TABS: 150.0000 ug | ORAL_TABLET | Freq: Every day | ORAL | 5 refills | Status: DC

## 2024-02-23 NOTE — Assessment & Plan Note (Signed)
 Sleeping better now. Shifted his hours. Follow. Denies snoring. States feels rested when wakes up.

## 2024-02-23 NOTE — Assessment & Plan Note (Signed)
 Recent A1c 5.6. low carb diet and exercise. Follow met b and A1c.

## 2024-02-23 NOTE — Assessment & Plan Note (Signed)
 Discussed recent cholesterol labs. Increase crestor  to 20mg  q day. Follow lipid panel. Continue diet and exercise.

## 2024-02-23 NOTE — Patient Instructions (Addendum)
 CTA chest (CT angiogram chest)

## 2024-02-23 NOTE — Assessment & Plan Note (Signed)
No flares since starting allopurinol

## 2024-02-23 NOTE — Assessment & Plan Note (Signed)
 Physical today 02/23/24. Colonoscopy 01/02/24 - normal, external hemorrhoids. Recommended f/u colonoscopy in 10 years. PSA .27 - 02/20/24.

## 2024-02-23 NOTE — Addendum Note (Signed)
 Addended by: GLENDIA ALLENA RAMAN on: 02/23/2024 12:28 PM   Modules accepted: Orders

## 2024-02-23 NOTE — Progress Notes (Addendum)
 Subjective:    Patient ID: Juan Hernandez, male    DOB: [birth year], 63 y.o.   MRN: [id number]  Patient here for  Chief Complaint  Patient presents with   Annual Exam    HPI Here for a physical exam. Colonoscopy 01/02/24 - colon normal, non bleeding external hemorrhoids. Recommended f/u colonoscopy in 10 years. 01/2023 - calcium  score - 153. Have discussed starting statin medication and aspirin.he is allergic to aspirin. Taking crestor  and tolerating.  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 Recommend annual imaging followup by CTA or MRA. Due f/u. Discussed. Wants to check with insurance. Started allopurinol  last visit for recurring gout flares. No further gout flares. No chest pain or sob. No abdominal pain or bowel change. Discussed labs.    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   History of gout    History of kidney stones    Hyperthyroidism    Hypothyroidism    Thyroid  disease    Past Surgical History:  Procedure Laterality Date   COLONOSCOPY N/A    COLONOSCOPY N/A 01/02/2024   Procedure: COLONOSCOPY;  Surgeon: Unk Corinn Skiff, MD;  [facility];  Service: Endoscopy;  Laterality: N/A;   FRACTURE SURGERY  1983   Left ankle compound fracture   Family History  Problem Relation Age of Onset   COPD Mother    Diabetes Mother    Arthritis Father    Obesity Father    Social History   Socioeconomic History   Marital status: Married    Spouse name: Not on file   Number of children: Not on file   Years of education: Not on file   Highest education level: Not on file  Occupational History   Not on file  Tobacco Use   Smoking status: Never   Smokeless tobacco: Former    Types: Designer, multimedia Use   Vaping status: Never Used  Substance and Sexual Activity   Alcohol use: Yes    Comment: occasionally will have 2 drinks of wine or  beer or liquor   Drug use: Yes    Types: Marijuana    Comment: in high school but not since   Sexual activity: Not Currently  Other Topics Concern   Not on file  Social History Narrative   Not on file   Social Drivers of Health   Financial Resource Strain: Not on file  Food Insecurity: Not on file  Transportation Needs: Not on file  Physical Activity: Not on file  Stress: Not on file  Social Connections: Not on file     Review of Systems  Constitutional:  Negative for appetite change and unexpected weight change.  HENT:  Negative for congestion, sinus pressure and sore throat.   Eyes:  Negative for pain and visual disturbance.  Respiratory:  Negative for cough, chest tightness and shortness of breath.   Cardiovascular:  Negative for chest pain, palpitations and leg swelling.  Gastrointestinal:  Negative for abdominal pain, diarrhea, nausea and vomiting.  Genitourinary:  Negative for difficulty urinating and dysuria.  Musculoskeletal:  Negative for joint swelling and myalgias.       Some hand stiffness.   Skin:  Negative for color change and rash.  Neurological:  Negative for dizziness and headaches.  Hematological:  Negative for adenopathy. Does not bruise/bleed easily.  Psychiatric/Behavioral:  Negative for agitation and  dysphoric mood.        Objective:     BP 104/70 (Cuff Size: Large)   Pulse 63   Temp (!) 97.5 F (36.4 C) (Oral)   Ht 5' 8 (1.727 m)   Wt 249 lb 8 oz (113.2 kg)   SpO2 95%   BMI 37.94 kg/m  Wt Readings from Last 3 Encounters:  02/23/24 249 lb 8 oz (113.2 kg)  01/02/24 237 lb 3.2 oz (107.6 kg)  10/21/23 244 lb (110.7 kg)    Physical Exam Constitutional:      General: He is not in acute distress.    Appearance: Normal appearance. He is well-developed.  HENT:     Head: Normocephalic and atraumatic.     Right Ear: External ear normal.     Left Ear: External ear normal.     Mouth/Throat:     Pharynx: No oropharyngeal exudate or posterior  oropharyngeal erythema.  Eyes:     General: No scleral icterus.       Right eye: No discharge.        Left eye: No discharge.     Conjunctiva/sclera: Conjunctivae normal.  Neck:     Thyroid : No thyromegaly.  Cardiovascular:     Rate and Rhythm: Normal rate and regular rhythm.  Pulmonary:     Effort: No respiratory distress.     Breath sounds: Normal breath sounds. No wheezing.  Abdominal:     General: Bowel sounds are normal.     Palpations: Abdomen is soft.     Tenderness: There is no abdominal tenderness.  Musculoskeletal:        General: No swelling or tenderness.     Cervical back: Neck supple. No tenderness.  Lymphadenopathy:     Cervical: No cervical adenopathy.  Skin:    Findings: No erythema or rash.  Neurological:     Mental Status: He is alert and oriented to person, place, and time.  Psychiatric:        Mood and Affect: Mood normal.        Behavior: Behavior normal.         Outpatient Encounter Medications as of 02/23/2024  Medication Sig   allopurinol  (ZYLOPRIM ) 300 MG tablet Take 1 tablet (300 mg total) by mouth daily.   colchicine  0.6 MG tablet Take 1 tablet (0.6 mg total) by mouth 2 (two) times daily as needed.   ibuprofen (ADVIL) 800 MG tablet Take 800 mg by mouth every 8 (eight) hours as needed for mild pain (pain score 1-3).   rosuvastatin  (CRESTOR ) 20 MG tablet Take 1 tablet (20 mg total) by mouth daily.   [DISCONTINUED] levothyroxine  (SYNTHROID ) 150 MCG tablet TAKE 1 TABLET BY MOUTH EVERY DAY   [DISCONTINUED] Magnesium 100 MG CAPS Take 1 capsule by mouth daily.   [DISCONTINUED] rosuvastatin  (CRESTOR ) 10 MG tablet Take 1 tablet (10 mg total) by mouth daily.   No facility-administered encounter medications on file as of 02/23/2024.     Lab Results  Component Value Date   WBC 6.8 10/19/2023   HGB 14.3 10/19/2023   HCT 44.8 10/19/2023   PLT 187 10/19/2023   GLUCOSE 107 (H) 02/20/2024   CHOL 134 02/20/2024   TRIG 69.0 02/20/2024   HDL 38.70 (L)  02/20/2024   LDLCALC 81 02/20/2024   ALT 25 02/20/2024   AST 26 02/20/2024   NA 138 02/20/2024   K 4.7 02/20/2024   CL 102 02/20/2024   CREATININE 0.96 02/20/2024   BUN 21 02/20/2024  CO2 27 02/20/2024   TSH 4.63 07/08/2023   PSA 0.27 02/20/2024   HGBA1C 5.6 02/20/2024    No results found.     Assessment & Plan:  Routine general medical examination at a health care facility  Healthcare maintenance Assessment & Plan: Physical today 02/23/24. Colonoscopy 01/02/24 - normal, external hemorrhoids. Recommended f/u colonoscopy in 10 years. PSA .27 - 02/20/24.    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 Recommend annual imaging followup by CTA or MRA. Due f/u. Discussed. Wants to check with insurance.   Sleep disturbance Assessment & Plan: Sleeping better now. Shifted his hours. Follow. Denies snoring. States feels rested when wakes up.    Hypothyroidism, unspecified type Assessment & Plan: On thyroid  replacement. Follow tsh.   Orders: -     TSH; Future  Hyperglycemia Assessment & Plan: Recent A1c 5.6. low carb diet and exercise. Follow met b and A1c.    Elevated coronary artery calcium  score Assessment & Plan: On crestor . Increase to 20mg  as outlined. Unable to take aspirin.    Gout, unspecified cause, unspecified chronicity, unspecified site Assessment & Plan: No flares since starting allopurinol .    Hypercholesterolemia Assessment & Plan: Discussed recent cholesterol labs. Increase crestor  to 20mg  q day. Follow lipid panel. Continue diet and exercise.    Other orders -     Rosuvastatin  Calcium ; Take 1 tablet (20 mg total) by mouth daily.  Dispense: 90 tablet; Refill: 1     Allena Hamilton, MD

## 2024-02-23 NOTE — Assessment & Plan Note (Signed)
 On thyroid replacement.  Follow tsh.

## 2024-02-23 NOTE — Assessment & Plan Note (Signed)
 On crestor . Increase to 20mg  as outlined. Unable to take aspirin.

## 2024-02-23 NOTE — Assessment & Plan Note (Signed)
 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 Recommend annual imaging followup by CTA or MRA. Due f/u. Discussed. Wants to check with insurance.

## 2024-03-20 ENCOUNTER — Other Ambulatory Visit: Payer: Self-pay | Admitting: Internal Medicine

## 2024-04-12 ENCOUNTER — Other Ambulatory Visit: Payer: Self-pay | Admitting: Internal Medicine

## 2024-04-12 NOTE — Telephone Encounter (Signed)
 Copied from CRM #[id number]. Topic: Clinical - Medication Refill >> Apr 12, 2024  2:29 PM Roselie C wrote: Medication: rosuvastatin  (CRESTOR ) 20 MG tablet Provider is supposed to increase to 40 mg. Patient was told take 2 and now out.   Has the patient contacted their pharmacy? Yes (Agent: If no, request that the patient contact the pharmacy for the refill. If patient does not wish to contact the pharmacy document the reason why and proceed with request.) (Agent: If yes, when and what did the pharmacy advise?)  This is the patient's preferred pharmacy:  [id number] [place], [place] - [address] [place] [address] Phone: [phone] Fax: [phone]  Is this the correct pharmacy for this prescription? Yes If no, delete pharmacy and type the correct one.   Has the prescription been filled recently? Yes  Is the patient out of the medication? Yes  Has the patient been seen for an appointment in the last year OR does the patient have an upcoming appointment? Yes  Can we respond through MyChart? Yes  Agent: Please be advised that Rx refills may take up to 3 business days. We ask that you follow-up with your pharmacy.

## 2024-04-13 MED ORDER — ROSUVASTATIN CALCIUM 40 MG PO TABS: 40.0000 mg | ORAL_TABLET | Freq: Every day | ORAL | 1 refills | Status: AC

## 2024-04-13 NOTE — Telephone Encounter (Signed)
 Discussed with Mr Oehlert. Taking cretor 40mg . Tolerating. Rx sent in for refill.

## 2024-05-19 ENCOUNTER — Other Ambulatory Visit: Payer: Self-pay | Admitting: Internal Medicine

## 2024-06-06 ENCOUNTER — Ambulatory Visit: Payer: Self-pay | Admitting: Internal Medicine

## 2024-06-06 ENCOUNTER — Other Ambulatory Visit: Payer: Self-pay | Admitting: Internal Medicine

## 2024-06-06 NOTE — Telephone Encounter (Signed)
 FYI Only or Action Required?: Action required by provider: medication refill request.  Patient was last seen in primary care on 02/23/2024 by Glendia Shad, MD.  Called Nurse Triage reporting Gout.  Symptoms began several days ago.  Interventions attempted: Prescription medications: Allopurinol .  Symptoms are: unchanged.  Triage Disposition: See Physician Within 24 Hours  Patient/caregiver understands and will follow disposition?: No, wishes to speak with PCP   Message from [facility] sent at 06/06/2024 11:39 AM EST  Reason for Triage: Patient experiencing severe pain in wrist    Denies swelling and redness  8/10 movement, constant Reason for Disposition  Weakness (i.e., loss of strength) of new-onset in hand or fingers  (Exceptions: Not truly weak, hand feels weak because of pain; weakness present > 2 weeks)    pain  Answer Assessment - Initial Assessment Questions Patient declines appt and request refill for Colchicine .  Advised ED/911 if symptoms occur/worsen: severe diff breathing, chest pain > 5 min, faint, numbness, redness, swelling, fever. Patient verbalized understanding.   1. ONSET: When did the pain start?     Monday 2. LOCATION: Where is the pain located?     Right wrist radiates to right shoulder 3. PAIN: How bad is the pain? (Scale 1-10; or mild, moderate, severe)     8/10 constant 5. CAUSE: What do you think is causing the pain?     Gout flare up 6. AGGRAVATING FACTORS: What makes the pain worse? (e.g., using computer)     movement 7. OTHER SYMPTOMS: Do you have any other symptoms? (e.g., fever, neck pain, numbness or tingling, rash, swelling)  Denies diff breathing, chest pain, faint, fever chills, n/v, redness, swelling, numbness, rash  Protocols used: Wrist Pain-A-AH

## 2024-06-07 NOTE — Telephone Encounter (Signed)
 In reviewing, it appears that colchicine  rx was sent in. Need f/u with pt.

## 2024-06-07 NOTE — Telephone Encounter (Signed)
 Per review of note, states wrist pain radiating up arm. This does not sound like gout. Is the joint swollen, red, etc. Needs to be evaluated to confirm diagnosis and what would be best treatment.

## 2024-06-07 NOTE — Telephone Encounter (Signed)
 Copied from CRM #[id number]. Topic: Clinical - Red Word Triage >> Jun 06, 2024 11:38 AM Terri MATSU wrote: Red Word that prompted transfer to Nurse Triage: Patient experiencing severe pain in wrist  Please warm transfer Red Word call to Nurse Triage and then click Send Message and Close CRM.

## 2024-06-08 NOTE — Telephone Encounter (Signed)
 Attempted to call pt no answer and voicemail box full.  Sending mychart message to follow up on pt.

## 2024-06-08 NOTE — Telephone Encounter (Signed)
 Attempted to call pt, no answer.  Voicemail box is full.

## 2024-06-14 NOTE — Telephone Encounter (Signed)
 Attempted to call pt again, no answer and unable to leave message voicemail box is full.  Mychart message is still unread from 06/08/24.

## 2024-06-15 ENCOUNTER — Other Ambulatory Visit: Payer: Self-pay | Admitting: Internal Medicine

## 2024-06-26 ENCOUNTER — Other Ambulatory Visit

## 2024-06-28 ENCOUNTER — Ambulatory Visit: Admitting: Internal Medicine
